# Patient Record
Sex: Female | Born: 1985 | Race: Black or African American | Hispanic: No | Marital: Single | Smoking: Current some day smoker
Health system: Southern US, Community
[De-identification: ages and names within clinical notes are randomized; demographics above are authoritative.]

## PROBLEM LIST (undated history)

## (undated) DIAGNOSIS — A749 Chlamydial infection, unspecified: Secondary | ICD-10-CM

## (undated) DIAGNOSIS — Z789 Other specified health status: Secondary | ICD-10-CM

## (undated) DIAGNOSIS — N809 Endometriosis, unspecified: Secondary | ICD-10-CM

## (undated) DIAGNOSIS — A599 Trichomoniasis, unspecified: Secondary | ICD-10-CM

## (undated) DIAGNOSIS — A549 Gonococcal infection, unspecified: Secondary | ICD-10-CM

## (undated) HISTORY — PX: NO PAST SURGERIES: SHX2092

## (undated) HISTORY — DX: Endometriosis, unspecified: N80.9

---
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
## 2010-08-29 NOTE — L&D Delivery Note (Signed)
Delivery Note At 4:36 AM a viable female was delivered via Vaginal, Spontaneous Delivery (Presentation: Left Occiput Anterior).  APGAR: , ; weight .   Placenta status: Intact, Spontaneous.  Cord: 3 vessels with the following complications: .  Cord pH: not done  Anesthesia: Epidural  Episiotomy: None Lacerations: 2nd degree Suture Repair: 2.0 chromic vicryl Est. Blood Loss (mL):   Mom to postpartum.  Baby to nursery-stable.  MARSHALL,BERNARD A 06/10/2011, 4:47 AM

## 2010-09-24 ENCOUNTER — Emergency Department (HOSPITAL_COMMUNITY)
Admission: EM | Admit: 2010-09-24 | Discharge: 2010-09-24 | Payer: Self-pay | Source: Home / Self Care | Admitting: Emergency Medicine

## 2010-09-24 LAB — URINALYSIS, ROUTINE W REFLEX MICROSCOPIC
Ketones, ur: 15 mg/dL — AB
Nitrite: NEGATIVE
Protein, ur: NEGATIVE mg/dL
Urine Glucose, Fasting: NEGATIVE mg/dL
pH: 6.5 (ref 5.0–8.0)

## 2010-09-24 LAB — DIFFERENTIAL
Basophils Absolute: 0.1 10*3/uL (ref 0.0–0.1)
Basophils Relative: 1 % (ref 0–1)
Eosinophils Absolute: 0.1 10*3/uL (ref 0.0–0.7)
Eosinophils Relative: 1 % (ref 0–5)
Lymphs Abs: 2.1 10*3/uL (ref 0.7–4.0)
Neutrophils Relative %: 73 % (ref 43–77)

## 2010-09-24 LAB — CBC
Platelets: 187 10*3/uL (ref 150–400)
RBC: 4.3 MIL/uL (ref 3.87–5.11)
RDW: 14.7 % (ref 11.5–15.5)
WBC: 12.9 10*3/uL — ABNORMAL HIGH (ref 4.0–10.5)

## 2010-09-24 LAB — WET PREP, GENITAL
Trich, Wet Prep: NONE SEEN
Yeast Wet Prep HPF POC: NONE SEEN

## 2010-09-24 LAB — POCT PREGNANCY, URINE: Preg Test, Ur: POSITIVE

## 2010-09-25 LAB — GC/CHLAMYDIA PROBE AMP, GENITAL: GC Probe Amp, Genital: NEGATIVE

## 2010-11-30 LAB — HEPATITIS B SURFACE ANTIGEN: Hepatitis B Surface Ag: NEGATIVE

## 2010-11-30 LAB — ABO/RH: RH Type: POSITIVE

## 2010-11-30 LAB — HIV ANTIBODY (ROUTINE TESTING W REFLEX): HIV: NONREACTIVE

## 2011-05-10 LAB — STREP B DNA PROBE: GBS: NEGATIVE

## 2011-06-01 ENCOUNTER — Other Ambulatory Visit: Payer: Self-pay | Admitting: Obstetrics & Gynecology

## 2011-06-01 DIAGNOSIS — O480 Post-term pregnancy: Secondary | ICD-10-CM

## 2011-06-02 ENCOUNTER — Other Ambulatory Visit: Payer: Self-pay | Admitting: *Deleted

## 2011-06-02 ENCOUNTER — Other Ambulatory Visit: Payer: Self-pay | Admitting: Obstetrics & Gynecology

## 2011-06-03 ENCOUNTER — Encounter (HOSPITAL_COMMUNITY): Payer: Self-pay

## 2011-06-03 ENCOUNTER — Ambulatory Visit (HOSPITAL_COMMUNITY)
Admission: RE | Admit: 2011-06-03 | Discharge: 2011-06-03 | Disposition: A | Discharge status: Home / Self Care | Payer: Medicaid Other | Source: Ambulatory Visit | Attending: Obstetrics & Gynecology | Admitting: Obstetrics & Gynecology

## 2011-06-03 DIAGNOSIS — O480 Post-term pregnancy: Secondary | ICD-10-CM

## 2011-06-06 ENCOUNTER — Telehealth (HOSPITAL_COMMUNITY): Payer: Self-pay | Admitting: *Deleted

## 2011-06-06 ENCOUNTER — Encounter (HOSPITAL_COMMUNITY): Payer: Self-pay | Admitting: *Deleted

## 2011-06-06 NOTE — Telephone Encounter (Signed)
Preadmission screen  

## 2011-06-08 ENCOUNTER — Inpatient Hospital Stay (HOSPITAL_COMMUNITY)
Admission: RE | Admit: 2011-06-08 | Discharge: 2011-06-12 | DRG: 775 | Disposition: A | Discharge status: Home / Self Care | Payer: Medicaid Other | Source: Ambulatory Visit | Attending: Obstetrics & Gynecology | Admitting: Obstetrics & Gynecology

## 2011-06-08 ENCOUNTER — Encounter (HOSPITAL_COMMUNITY): Payer: Self-pay

## 2011-06-08 DIAGNOSIS — O480 Post-term pregnancy: Principal | ICD-10-CM | POA: Diagnosis present

## 2011-06-08 HISTORY — DX: Other specified health status: Z78.9

## 2011-06-08 LAB — RPR: RPR Ser Ql: NONREACTIVE

## 2011-06-08 LAB — CBC
Hemoglobin: 12.3 g/dL (ref 12.0–15.0)
MCH: 31.9 pg (ref 26.0–34.0)
RBC: 3.86 MIL/uL — ABNORMAL LOW (ref 3.87–5.11)
WBC: 11 10*3/uL — ABNORMAL HIGH (ref 4.0–10.5)

## 2011-06-08 MED ORDER — CITRIC ACID-SODIUM CITRATE 334-500 MG/5ML PO SOLN: 30.0000 mL | ORAL | Status: DC | PRN

## 2011-06-08 MED ORDER — BUTORPHANOL TARTRATE 2 MG/ML IJ SOLN
1.0000 mg | INTRAMUSCULAR | Status: DC | PRN
  Administered 2011-06-08 – 2011-06-09 (×3): 1 mg via INTRAVENOUS
  Filled 2011-06-08 (×2): qty 1

## 2011-06-08 MED ORDER — SODIUM CHLORIDE 0.9 % IV SOLN: 250.0000 mL | INTRAVENOUS | Status: DC

## 2011-06-08 MED ORDER — LACTATED RINGERS IV SOLN
INTRAVENOUS | Status: DC
  Administered 2011-06-08 – 2011-06-09 (×3): via INTRAVENOUS
  Administered 2011-06-09: 999 mL/h via INTRAVENOUS
  Administered 2011-06-09 – 2011-06-10 (×2): via INTRAVENOUS

## 2011-06-08 MED ORDER — OXYTOCIN 20 UNITS IN LACTATED RINGERS INFUSION - SIMPLE
125.0000 mL/h | Freq: Once | INTRAVENOUS | Status: DC
Start: 2011-06-08 — End: 2011-06-10

## 2011-06-08 MED ORDER — OXYTOCIN BOLUS FROM INFUSION
500.0000 mL | Freq: Once | INTRAVENOUS | Status: DC
  Filled 2011-06-08: qty 500
  Filled 2011-06-08: qty 1000

## 2011-06-08 MED ORDER — BUTORPHANOL TARTRATE 2 MG/ML IJ SOLN
INTRAMUSCULAR | Status: AC
  Filled 2011-06-08: qty 1

## 2011-06-08 MED ORDER — TERBUTALINE SULFATE 1 MG/ML IJ SOLN: 0.2500 mg | Freq: Once | INTRAMUSCULAR | Status: AC | PRN

## 2011-06-08 MED ORDER — LACTATED RINGERS IV SOLN
500.0000 mL | INTRAVENOUS | Status: DC | PRN
Start: 2011-06-08 — End: 2011-06-10

## 2011-06-08 MED ORDER — FLEET ENEMA 7-19 GM/118ML RE ENEM: 1.0000 | ENEMA | RECTAL | Status: DC | PRN

## 2011-06-08 MED ORDER — SODIUM CHLORIDE 0.9 % IJ SOLN
3.0000 mL | INTRAMUSCULAR | Status: DC | PRN
  Administered 2011-06-09: 3 mL via INTRAVENOUS

## 2011-06-08 MED ORDER — ACETAMINOPHEN 325 MG PO TABS
650.0000 mg | ORAL_TABLET | ORAL | Status: DC | PRN
  Administered 2011-06-09: 650 mg via ORAL
  Filled 2011-06-08: qty 2

## 2011-06-08 MED ORDER — MISOPROSTOL 25 MCG QUARTER TABLET
25.0000 ug | ORAL_TABLET | ORAL | Status: DC | PRN
  Administered 2011-06-08 – 2011-06-09 (×5): 25 ug via VAGINAL
  Filled 2011-06-08 (×2): qty 0.25
  Filled 2011-06-08: qty 1
  Filled 2011-06-08 (×3): qty 0.25

## 2011-06-08 MED ORDER — SODIUM CHLORIDE 0.9 % IJ SOLN: 3.0000 mL | Freq: Two times a day (BID) | INTRAMUSCULAR | Status: DC

## 2011-06-08 MED ORDER — OXYCODONE-ACETAMINOPHEN 5-325 MG PO TABS
2.0000 | ORAL_TABLET | ORAL | Status: DC | PRN
  Administered 2011-06-10 – 2011-06-12 (×7): 2 via ORAL
  Filled 2011-06-08 (×6): qty 2
  Filled 2011-06-08: qty 1

## 2011-06-08 MED ORDER — LIDOCAINE HCL (PF) 1 % IJ SOLN
30.0000 mL | INTRAMUSCULAR | Status: DC | PRN
  Administered 2011-06-10: 30 mL via SUBCUTANEOUS
  Filled 2011-06-08: qty 30

## 2011-06-08 MED ORDER — ONDANSETRON HCL 4 MG/2ML IJ SOLN
4.0000 mg | Freq: Four times a day (QID) | INTRAMUSCULAR | Status: DC | PRN
  Administered 2011-06-09: 4 mg via INTRAVENOUS
  Filled 2011-06-08 (×2): qty 2

## 2011-06-08 NOTE — H&P (Signed)
Erica Mcclure is a 25 y.o. female presenting for IOL. Maternal Medical History:  Reason for admission: Reason for Admission:   nauseaIOL secondary to post dates  Fetal activity: Perceived fetal activity is normal.    Prenatal complications: no prenatal complications   OB History    Grav Para Term Preterm Abortions TAB SAB Ect Mult Living   1              Past Medical History  Diagnosis Date  . Asthma     had inhaler in 2011 unsure of name  . No pertinent past medical history    Past Surgical History  Procedure Date  . Laparoscopy 2005  . No past surgeries    Family History: family history includes Depression in her mother; Heart disease in her paternal grandmother; and Stroke in her paternal grandmother.  There is no history of Anesthesia problems, and Hypotension, and Malignant hyperthermia, and Pseudochol deficiency, . Social History:  reports that she has never smoked. She does not have any smokeless tobacco history on file. She reports that she does not drink alcohol or use illicit drugs.  Review of Systems  Constitutional: Negative for fever.  Eyes: Negative for blurred vision.  Respiratory: Negative for shortness of breath.   Gastrointestinal: Negative for nausea and vomiting.  Skin: Negative for rash.  Neurological: Negative for headaches.    Dilation: 1 Effacement (%): Thick Station: -2 Exam by:: Clearence Cheek RN Blood pressure 120/67, pulse 77, temperature 98.1 F (36.7 C), temperature source Oral, resp. rate 20, height 5\' 2"  (1.575 m), weight 87.544 kg (193 lb). Maternal Exam:  Abdomen: Patient reports no abdominal tenderness. Introitus: not evaluated.     Fetal Exam Fetal Monitor Review: Variability: moderate (6-25 bpm).   Pattern: accelerations present and no decelerations.    Fetal State Assessment: Category I - tracings are normal.     Physical Exam  Constitutional: She appears well-developed.  HENT:  Head: Normocephalic.  Neck: Neck  supple. No thyromegaly present.  Cardiovascular: Normal rate and regular rhythm.   Respiratory: Breath sounds normal.  GI: Soft. Bowel sounds are normal.  Skin: No rash noted.    Prenatal labs: ABO, Rh: O/Positive/-- (04/03 0000) Antibody: Negative (04/03 0000) Rubella:   RPR: NON REACTIVE (01/27 1320)  HBsAg: Negative (04/03 0000)  HIV: Non-reactive (04/03 0000)  GBS: Negative (09/11 0000)   Assessment/Plan: 25 y.o.  W/an IUP @ [redacted]w[redacted]d for IOL secondary to post dates  Admit Two-stage IOL   JACKSON-MOORE,Tanish Sinkler A 06/08/2011, 1:49 PM

## 2011-06-09 ENCOUNTER — Inpatient Hospital Stay (HOSPITAL_COMMUNITY): Payer: Medicaid Other | Admitting: Anesthesiology

## 2011-06-09 ENCOUNTER — Encounter (HOSPITAL_COMMUNITY): Payer: Self-pay | Admitting: Anesthesiology

## 2011-06-09 MED ORDER — PHENYLEPHRINE 40 MCG/ML (10ML) SYRINGE FOR IV PUSH (FOR BLOOD PRESSURE SUPPORT)
80.0000 ug | PREFILLED_SYRINGE | INTRAVENOUS | Status: DC | PRN
  Filled 2011-06-09 (×2): qty 5

## 2011-06-09 MED ORDER — EPHEDRINE 5 MG/ML INJ
10.0000 mg | INTRAVENOUS | Status: DC | PRN
  Filled 2011-06-09: qty 4

## 2011-06-09 MED ORDER — FENTANYL 2.5 MCG/ML BUPIVACAINE 1/10 % EPIDURAL INFUSION (WH - ANES)
INTRAMUSCULAR | Status: DC | PRN
  Administered 2011-06-09: 14 mL/h via EPIDURAL

## 2011-06-09 MED ORDER — EPHEDRINE 5 MG/ML INJ
10.0000 mg | INTRAVENOUS | Status: DC | PRN
  Filled 2011-06-09 (×2): qty 4

## 2011-06-09 MED ORDER — ZOLPIDEM TARTRATE 10 MG PO TABS
10.0000 mg | ORAL_TABLET | Freq: Every evening | ORAL | Status: DC | PRN
  Administered 2011-06-09: 10 mg via ORAL
  Filled 2011-06-09: qty 1

## 2011-06-09 MED ORDER — TERBUTALINE SULFATE 1 MG/ML IJ SOLN: 0.2500 mg | Freq: Once | INTRAMUSCULAR | Status: AC | PRN

## 2011-06-09 MED ORDER — SODIUM BICARBONATE 8.4 % IV SOLN
INTRAVENOUS | Status: DC | PRN
  Administered 2011-06-09: 5 mL via EPIDURAL

## 2011-06-09 MED ORDER — PHENYLEPHRINE 40 MCG/ML (10ML) SYRINGE FOR IV PUSH (FOR BLOOD PRESSURE SUPPORT)
80.0000 ug | PREFILLED_SYRINGE | INTRAVENOUS | Status: DC | PRN
  Filled 2011-06-09: qty 5

## 2011-06-09 MED ORDER — DIPHENHYDRAMINE HCL 50 MG/ML IJ SOLN: 12.5000 mg | INTRAMUSCULAR | Status: DC | PRN

## 2011-06-09 MED ORDER — OXYTOCIN 20 UNITS IN LACTATED RINGERS INFUSION - SIMPLE
1.0000 m[IU]/min | INTRAVENOUS | Status: DC
  Administered 2011-06-09: 2 m[IU]/min via INTRAVENOUS
  Administered 2011-06-10: 333 m[IU]/min via INTRAVENOUS

## 2011-06-09 MED ORDER — FENTANYL 2.5 MCG/ML BUPIVACAINE 1/10 % EPIDURAL INFUSION (WH - ANES)
14.0000 mL/h | INTRAMUSCULAR | Status: DC
  Administered 2011-06-09 – 2011-06-10 (×3): 14 mL/h via EPIDURAL
  Filled 2011-06-09 (×4): qty 60

## 2011-06-09 MED ORDER — LACTATED RINGERS IV SOLN: 500.0000 mL | Freq: Once | INTRAVENOUS | Status: DC

## 2011-06-09 NOTE — Anesthesia Procedure Notes (Signed)
Epidural Patient location during procedure: OB  Preanesthetic Checklist Completed: patient identified, site marked, surgical consent, pre-op evaluation, timeout performed, IV checked, risks and benefits discussed and monitors and equipment checked  Epidural Patient position: sitting Prep: site prepped and draped and DuraPrep Patient monitoring: continuous pulse ox and blood pressure Approach: midline Injection technique: LOR air  Needle:  Needle type: Tuohy  Needle gauge: 17 G Needle length: 9 cm Needle insertion depth: 6 cm Catheter type: closed end flexible Catheter size: 19 Gauge Catheter at skin depth: 12 cm Test dose: negative  Assessment Events: blood not aspirated, injection not painful, no injection resistance, negative IV test and no paresthesia  Additional Notes Dosing of Epidural: 1st dose, through needle...... ( mg expressed as equavilent  cc's medication  from .1%Bupiv / fentanyl syringe from L&D pump)...............  5mg Marcaine  2nd dose, through catheter after waiting 3 minutes.... epi 1:200K + Xylocaine 40 mg 3rd dose, through catheter, after waiting 3 minutes.....epi 1:200K + Xylocaine 60 mg ( 2% Xylo charted as a single dose in Epic Meds for ease of charting; actual dosing was fractionated as above, for saftey's sake)  As each dose occurred, patient was free of IV sx; and patient exhibited no evidence of SA injection.  Patient is more comfortable after epidural dosed. Please see RN's note for documentation of vital signs,and FHR which are stable.    

## 2011-06-09 NOTE — Anesthesia Preprocedure Evaluation (Signed)
Anesthesia Evaluation  Name, MR# and DOB Patient awake  General Assessment Comment  Reviewed: Allergy & Precautions, H&P , Patient's Chart, lab work & pertinent test results  Airway Mallampati: II TM Distance: >3 FB Neck ROM: full    Dental No notable dental hx.    Pulmonary Asthma: seasonal in spring.  clear to auscultation  Pulmonary exam normal       Cardiovascular Exercise Tolerance: Good regular Normal    Neuro/Psych    GI/Hepatic   Endo/Other  Morbid obesity  Renal/GU      Musculoskeletal   Abdominal   Peds  Hematology   Anesthesia Other Findings   Reproductive/Obstetrics                           Anesthesia Physical Anesthesia Plan  ASA: II  Anesthesia Plan: Epidural   Post-op Pain Management:    Induction:   Airway Management Planned:   Additional Equipment:   Intra-op Plan:   Post-operative Plan:   Informed Consent: I have reviewed the patients History and Physical, chart, labs and discussed the procedure including the risks, benefits and alternatives for the proposed anesthesia with the patient or authorized representative who has indicated his/her understanding and acceptance.   Dental Advisory Given  Plan Discussed with: CRNA and Surgeon  Anesthesia Plan Comments: (Labs checked- platelets confirmed with RN in room. Fetal heart tracing, per RN, reportedly stable enough for sitting procedure. Discussed epidural, and patient consents to the procedure:  included risk of possible headache,backache, failed block, allergic reaction, and nerve injury. This patient was asked if she had any questions or concerns before the procedure started. )        Anesthesia Quick Evaluation

## 2011-06-09 NOTE — Progress Notes (Signed)
Erica Mcclure is a 25 y.o. G1P0 at [redacted]w[redacted]d by LMP admitted for induction of labor due to post dates.  Subjective: Uncomfortable  Objective: BP 135/53  Pulse 66  Temp(Src) 98.4 F (36.9 C) (Oral)  Resp 20  Ht 5\' 2"  (1.575 m)  Wt 87.544 kg (193 lb)  BMI 35.30 kg/m2      FHT:  FHR: 140 bpm, variability: moderate,  accelerations:  Present,  decelerations:  Absent UC:   irregular, every 5 minutes SVE:   Dilation: 2 Effacement (%): 80 Station: -2 Exam by:: jackson-moore (IUPC placed)  Labs: Lab Results  Component Value Date   WBC 11.0* 06/08/2011   HGB 12.3 06/08/2011   HCT 36.2 06/08/2011   MCV 93.8 06/08/2011   PLT 160 06/08/2011    Assessment / Plan: Early labor  Labor: Pitocin for MVU <200 Preeclampsia:  N/A Fetal Wellbeing:  Category I Pain Control:  Labor support without medications I/D:  n/a Anticipated MOD:  NSVD  JACKSON-MOORE,Tevita Gomer A 06/09/2011, 2:00 PM

## 2011-06-10 ENCOUNTER — Encounter (HOSPITAL_COMMUNITY): Payer: Self-pay

## 2011-06-10 ENCOUNTER — Other Ambulatory Visit: Payer: Self-pay | Admitting: Obstetrics

## 2011-06-10 LAB — HEMOGLOBIN: Hemoglobin: 11.1 g/dL — ABNORMAL LOW (ref 12.0–15.0)

## 2011-06-10 MED ORDER — TETANUS-DIPHTH-ACELL PERTUSSIS 5-2.5-18.5 LF-MCG/0.5 IM SUSP: 0.5000 mL | Freq: Once | INTRAMUSCULAR | Status: DC

## 2011-06-10 MED ORDER — ONDANSETRON HCL 4 MG/2ML IJ SOLN: 4.0000 mg | INTRAMUSCULAR | Status: DC | PRN

## 2011-06-10 MED ORDER — IBUPROFEN 600 MG PO TABS: 600.0000 mg | ORAL_TABLET | Freq: Four times a day (QID) | ORAL | Status: DC

## 2011-06-10 MED ORDER — SIMETHICONE 80 MG PO CHEW: 80.0000 mg | CHEWABLE_TABLET | ORAL | Status: DC | PRN

## 2011-06-10 MED ORDER — WITCH HAZEL-GLYCERIN EX PADS
1.0000 "application " | MEDICATED_PAD | CUTANEOUS | Status: DC | PRN
  Administered 2011-06-10 – 2011-06-11 (×2): 1 via TOPICAL

## 2011-06-10 MED ORDER — DIPHENHYDRAMINE HCL 25 MG PO CAPS: 25.0000 mg | ORAL_CAPSULE | Freq: Four times a day (QID) | ORAL | Status: DC | PRN

## 2011-06-10 MED ORDER — FERROUS SULFATE 325 (65 FE) MG PO TABS
325.0000 mg | ORAL_TABLET | Freq: Two times a day (BID) | ORAL | Status: DC
  Administered 2011-06-10 – 2011-06-11 (×3): 325 mg via ORAL
  Filled 2011-06-10 (×4): qty 1

## 2011-06-10 MED ORDER — PRENATAL PLUS 27-1 MG PO TABS
1.0000 | ORAL_TABLET | Freq: Every day | ORAL | Status: DC
  Administered 2011-06-10 – 2011-06-11 (×2): 1 via ORAL
  Filled 2011-06-10 (×2): qty 1

## 2011-06-10 MED ORDER — ZOLPIDEM TARTRATE 5 MG PO TABS: 5.0000 mg | ORAL_TABLET | Freq: Every evening | ORAL | Status: DC | PRN

## 2011-06-10 MED ORDER — OXYCODONE HCL 10 MG PO TB12
10.0000 mg | ORAL_TABLET | Freq: Once | ORAL | Status: AC
  Administered 2011-06-10: 10 mg via ORAL
  Filled 2011-06-10: qty 1

## 2011-06-10 MED ORDER — ONDANSETRON HCL 4 MG PO TABS: 4.0000 mg | ORAL_TABLET | ORAL | Status: DC | PRN

## 2011-06-10 MED ORDER — DIBUCAINE 1 % RE OINT
1.0000 "application " | TOPICAL_OINTMENT | RECTAL | Status: DC | PRN
  Administered 2011-06-10 – 2011-06-11 (×2): 1 via RECTAL
  Filled 2011-06-10 (×3): qty 28

## 2011-06-10 MED ORDER — OXYCODONE-ACETAMINOPHEN 5-325 MG PO TABS
1.0000 | ORAL_TABLET | ORAL | Status: DC | PRN
  Administered 2011-06-11 – 2011-06-12 (×2): 1 via ORAL
  Filled 2011-06-10 (×2): qty 1
  Filled 2011-06-10: qty 2

## 2011-06-10 MED ORDER — LANOLIN HYDROUS EX OINT: TOPICAL_OINTMENT | CUTANEOUS | Status: DC | PRN

## 2011-06-10 MED ORDER — BENZOCAINE-MENTHOL 20-0.5 % EX AERO: 1.0000 "application " | INHALATION_SPRAY | CUTANEOUS | Status: DC | PRN

## 2011-06-10 MED ORDER — BENZOCAINE-MENTHOL 20-0.5 % EX AERO
INHALATION_SPRAY | CUTANEOUS | Status: AC
  Administered 2011-06-10: 08:00:00
  Filled 2011-06-10: qty 56

## 2011-06-10 MED ORDER — SENNOSIDES-DOCUSATE SODIUM 8.6-50 MG PO TABS
2.0000 | ORAL_TABLET | Freq: Every day | ORAL | Status: DC
  Administered 2011-06-10 – 2011-06-11 (×2): 2 via ORAL

## 2011-06-10 NOTE — Progress Notes (Signed)
  Post Partum Day 0 S/P spontaneous vaginal RH status/Rubella reviewed.  Feeding: breast Subjective: No HA, SOB, CP, F/C, breast symptoms. Normal vaginal bleeding, no clots.     Objective: BP 102/66  Pulse 85  Temp(Src) 98 F (36.7 C) (Oral)  Resp 18  Ht 5\' 2"  (1.575 m)  Wt 87.544 kg (193 lb)  BMI 35.30 kg/m2  SpO2 98%  Breastfeeding? Unknown   Physical Exam:  General: alert Lochia: appropriate Uterine Fundus: firm DVT Evaluation: No evidence of DVT seen on physical exam. Ext: No c/c/e  Basename 06/08/11 0730  HGB 12.3  HCT 36.2      Assessment/Plan: 25 y.o.  PPD #0 .  normal postpartum exam Continue current postpartum care  Ambulate   LOS: 2 days   JACKSON-MOORE,Willoughby Doell A 06/10/2011, 8:59 AM

## 2011-06-10 NOTE — Progress Notes (Signed)
PSYCHOSOCIAL ASSESSMENT ~ MATERNAL/CHILD Name: Erica Mcclure                                                                                           Age: 25   Referral Date: 06/10/11   Reason/Source: NICU Support/NICU  I. FAMILY/HOME ENVIRONMENT A. Child's Legal Guardian _x__Parent(s) ___Grandparent ___Foster parent ___DSS_________________ Name: Erica Mcclure                                 DOB: 08/27/1986           Age: 25   Address: [address], [place], [place] [address]  Name: Erica Mcclure                                        DOB: //                     Age:   Address: same  B. Other Household Members/Support Persons Name:                                         Relationship:                        DOB ___/___/___                   Name:                                         Relationship:                        DOB ___/___/___                   Name:                                         Relationship:                        DOB ___/___/___                   Name:                                         Relationship:                        DOB ___/___/___  C. Other Support: Good family support.  Family with her in room.   II. PSYCHOSOCIAL DATA A. Information   Source                                                                                                 _x_Patient Interview  _x_Family Interview           _x_Other: chart  B. Financial and Community Resources _x_Employment: FOB works for [employer], a v[employer]. _x_Medicaid    County: [place]                 __Private Insurance:                   __Self Pay  __Food Stamps   __WIC __Work First     __Public Housing     __Section 8    __Maternity Care Coordination/Child Service Coordination/Early Intervention  __School:                                                                         Grade:  __Other:   C. Cultural and Environment Information Cultural Issues Impacting Care: none  known  III. STRENGTHS _x__Supportive family/friends _x__Adequate Resources _x__Compliance with medical plan _x__Home prepared for Child (including basic supplies) _x__Understanding of illness      _x__Other: Pediatrician will be Dr. Rubin IV. RISK FACTORS AND CURRENT PROBLEMS         __x__No Problems Noted                                                                                                                                                                                                                                       Pt              Family     Substance Abuse                                                                  ___              ___        Mental Illness                                                                        ___              ___  Family/Relationship Issues                                      ___               ___             Abuse/Neglect/Domestic Violence                                         ___         ___  Financial Resources                                        ___              ___             Transportation                                                                        ___               ___  DSS Involvement                                                                   ___              ___  Adjustment to Illness                                                               ___              ___  Knowledge/Cognitive Deficit                                                     ___              ___             Compliance with Treatment                                                 ___              ___  Basic Needs (food, housing, etc.)                                          ___              ___             Housing Concerns                                       ___              ___ Other_____________________________________________________________            V. SOCIAL WORK ASSESSMENT SW met with MOB in her first floor room to introduce  myself, complete assessment and evaluate how she is coping with baby's admission to NICU.  She was very pleasant and told SW about her baby's birth and how scary it was when he came out with the cord around his neck.  SW discussed typical trauma reactions and stress of the situation.  She seemed very appreciative.  She states that she is glad he is doing okay, but wanted to know how long he would have to be in the NICU.  SW explained that they will probably have to do lab work and assess him for a period of time before they can make a plan for d/c.  She was understanding.  She states no issues with transportation if she has to d/c before the baby does and SW prepared her for this possibility.  She reports FOB is involved and supportive and that she has a great support system.  She also has the necessities for baby at home.  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  SW discussed this with her.  She thanked SW numerous times for coming to visit.  SW noticed a "previous" hx. Of marijuana use in MOB's chart, but did not get to discuss this with MOB since she had family present.  It is not clear in chart when this history was.  SW does not have any other concerns since meeting with MOB and will attempt to follow up on the issue of marijuana at a later time.  SW explained support services offered by NICU SWs.  MOB and family have no questions or needs at this time.  VI. SOCIAL WORK PLAN  ___No Further Intervention Required/No Barriers to Discharge   _x__Psychosocial Support and Ongoing Assessment of Needs   ___Patient/Family Education:   ___Child Protective Services Report   County___________ Date___/____/____   ___Information/Referral to Community Resources_________________________   ___Other:        

## 2011-06-10 NOTE — Progress Notes (Signed)
UR chart review completed.  

## 2011-06-11 LAB — CBC
MCH: 31.4 pg (ref 26.0–34.0)
MCV: 93 fL (ref 78.0–100.0)
Platelets: 147 10*3/uL — ABNORMAL LOW (ref 150–400)
RDW: 15.3 % (ref 11.5–15.5)

## 2011-06-11 NOTE — Anesthesia Postprocedure Evaluation (Signed)
  Anesthesia Post-op Note  Patient: Erica Mcclure  Procedure(s) Performed: * No procedures listed *  Patient Location: PACU and Mother/Baby  Anesthesia Type: Epidural  Level of Consciousness: awake, alert  and oriented  Airway and Oxygen Therapy: Patient Spontanous Breathing   Post-op Assessment: Patient's Cardiovascular Status Stable and Respiratory Function Stable  Post-op Vital Signs: stable  Complications: No apparent anesthesia complications

## 2011-06-11 NOTE — Progress Notes (Signed)
Encounter addended by: Len Blalock on: 06/11/2011  5:14 PM<BR>     Documentation filed: Notes Section, Charges VN

## 2011-06-11 NOTE — Progress Notes (Signed)
  Post Partum Day 1 S/P spontaneous vaginal RH status/Rubella reviewed.  Feeding: breast Subjective: No HA, SOB, CP, F/C, breast symptoms. Normal vaginal bleeding, no clots.     Objective: BP 96/54  Pulse 80  Temp(Src) 98.6 F (37 C) (Oral)  Resp 18  Ht 5\' 2"  (1.575 m)  Wt 87.544 kg (193 lb)  BMI 35.30 kg/m2  SpO2 98%  Breastfeeding? Unknown   Physical Exam:  General: alert Lochia: appropriate Uterine Fundus: firm DVT Evaluation: No evidence of DVT seen on physical exam. Ext: No c/c/e  Basename 06/11/11 0528 06/10/11 2230  HGB 10.8* 11.1*  HCT 32.0* --      Assessment/Plan: 25 y.o.  PPD #1 .  normal postpartum exam Continue current postpartum care  Ambulate   LOS: 3 days   JACKSON-MOORE,Damontre Millea A 06/11/2011, 9:56 AM

## 2011-06-12 MED ORDER — NORETHINDRONE 0.35 MG PO TABS: 1.0000 | ORAL_TABLET | Freq: Every day | ORAL | Status: DC

## 2011-06-12 MED ORDER — BENZOCAINE-MENTHOL 20-0.5 % EX AERO
INHALATION_SPRAY | CUTANEOUS | Status: AC
  Filled 2011-06-12: qty 56

## 2011-06-12 MED ORDER — OXYCODONE-ACETAMINOPHEN 5-325 MG PO TABS: 1.0000 | ORAL_TABLET | ORAL | Status: AC | PRN

## 2011-06-12 NOTE — Discharge Summary (Signed)
  Obstetric Discharge Summary Reason for Admission: onset of labor Prenatal Procedures: none Intrapartum Procedures: spontaneous vaginal delivery Postpartum Procedures: none Complications-Operative and Postpartum: none  Hemoglobin  Date Value Range Status  06/11/2011 10.8* 12.0-15.0 (g/dL) Final     HCT  Date Value Range Status  06/11/2011 32.0* 36.0-46.0 (%) Final    Discharge Diagnoses: Term Pregnancy-delivered  Discharge Information: Date: 06/12/2011 Activity: pelvic rest Diet: routine Medications: Percocet, Micronor Condition: stable Instructions: refer to routine discharge instructions Discharge to: home Follow-up Information    Follow up with HARPER,CHARLES A, MD. Call in 6 weeks.   Contact information:   [address] [place] [address] [phone]          Newborn Data: Live born  Information for the patient's newborn:  Mattilyn, Crites [[id number]]  female NICU  JACKSON-MOORE,Emsley Custer A 06/12/2011, 11:16 AM

## 2011-06-12 NOTE — Progress Notes (Signed)
Post Partum Day #2 S/P:spontaneous vaginal  RH status/Rubella reviewed.  Feeding: breast Subjective: Pt not in room.    Objective:  Blood pressure 112/64, pulse 86, temperature 98.6 F (37 C), temperature source Oral, resp. rate 18, height 5\' 2"  (1.575 m), weight 87.544 kg (193 lb), SpO2 98.00%, unknown if currently breastfeeding.   Physical Exam:  Deferred  Basename 06/11/11 0528 06/10/11 2230  HGB 10.8* 11.1*  HCT 32.0* --    Assessment/Plan: 25 y.o.  PPD # 2 .  normal postpartum exam Continue current postpartum care D/C home   LOS: 4 days   Mcclure,Erica Bower A 06/12/2011, 11:10 AM

## 2011-09-19 ENCOUNTER — Encounter (HOSPITAL_COMMUNITY): Payer: Self-pay | Admitting: *Deleted

## 2011-09-19 ENCOUNTER — Emergency Department (HOSPITAL_COMMUNITY)
Admission: EM | Admit: 2011-09-19 | Discharge: 2011-09-20 | Disposition: A | Discharge status: Home / Self Care | Payer: Medicaid Other | Attending: Emergency Medicine | Admitting: Emergency Medicine

## 2011-09-19 DIAGNOSIS — R05 Cough: Secondary | ICD-10-CM | POA: Insufficient documentation

## 2011-09-19 DIAGNOSIS — J45909 Unspecified asthma, uncomplicated: Secondary | ICD-10-CM | POA: Insufficient documentation

## 2011-09-19 DIAGNOSIS — J069 Acute upper respiratory infection, unspecified: Secondary | ICD-10-CM | POA: Insufficient documentation

## 2011-09-19 DIAGNOSIS — J3489 Other specified disorders of nose and nasal sinuses: Secondary | ICD-10-CM | POA: Insufficient documentation

## 2011-09-19 DIAGNOSIS — H9209 Otalgia, unspecified ear: Secondary | ICD-10-CM | POA: Insufficient documentation

## 2011-09-19 DIAGNOSIS — R059 Cough, unspecified: Secondary | ICD-10-CM | POA: Insufficient documentation

## 2011-09-19 DIAGNOSIS — R070 Pain in throat: Secondary | ICD-10-CM | POA: Insufficient documentation

## 2011-09-19 LAB — RAPID STREP SCREEN (MED CTR MEBANE ONLY): Streptococcus, Group A Screen (Direct): NEGATIVE

## 2011-09-19 NOTE — ED Notes (Signed)
sorethroat for 4 days.  hoarseness

## 2011-09-20 MED ORDER — OXYCODONE-ACETAMINOPHEN 5-325 MG PO TABS
2.0000 | ORAL_TABLET | ORAL | Status: AC | PRN
Start: 2011-09-20 — End: 2011-09-30

## 2011-09-20 MED ORDER — OXYCODONE-ACETAMINOPHEN 5-325 MG PO TABS
1.0000 | ORAL_TABLET | Freq: Once | ORAL | Status: AC
  Administered 2011-09-20: 1 via ORAL
  Filled 2011-09-20: qty 1

## 2011-09-20 NOTE — ED Notes (Signed)
DR. Weldon Inches AT BEDSIDE EVALUATING PT. / DICUSSING PLAN OF CARE .

## 2011-09-20 NOTE — Discharge Instructions (Signed)
Your strep test is negative.  You have a viral infection.  Use NyQuil to help reduce her symptoms.  Use ibuprofen 600 mg every 6 hours for pain.  Use Percocet for more severe pain.  Followup with your Dr. if your symptoms.  Last more than 2-3 days.  Return for worse or uncontrolled symptoms.

## 2011-09-20 NOTE — ED Provider Notes (Signed)
History     CSN: [id number]  Arrival date & time 09/19/11  2001   First MD Initiated Contact with Patient 09/20/11 0103      Chief Complaint  Patient presents with  . Sore Throat    (Consider location/radiation/quality/duration/timing/severity/associated sxs/prior treatment) Patient is a 26 y.o. female presenting with pharyngitis. The history is provided by the patient.  Sore Throat Pertinent negatives include no headaches and no shortness of breath.   the patient is a 26 year old, female, who complains of left ear pain, nonproductive cough, sore throat, and congestion for several days.  She has not had a fever.  She denies chest pain or shortness of breath.  She has no pain besides in her ear and throat.  She has no other symptoms.  Past Medical History  Diagnosis Date  . Asthma     had inhaler in 2011 unsure of name  . No pertinent past medical history     Past Surgical History  Procedure Date  . Laparoscopy 2005  . No past surgeries     Family History  Problem Relation Age of Onset  . Anesthesia problems Neg Hx   . Hypotension Neg Hx   . Malignant hyperthermia Neg Hx   . Pseudochol deficiency Neg Hx   . Depression Mother   . Stroke Paternal Grandmother   . Heart disease Paternal Grandmother     History  Substance Use Topics  . Smoking status: Never Smoker   . Smokeless tobacco: Not on file  . Alcohol Use: No    OB History    Grav Para Term Preterm Abortions TAB SAB Ect Mult Living   1 1 1       1       Review of Systems  Constitutional: Negative for fever and chills.  HENT: Positive for ear pain and congestion. Negative for ear discharge.   Respiratory: Positive for cough. Negative for shortness of breath.   Gastrointestinal: Negative for nausea and vomiting.  Neurological: Negative for headaches.  Psychiatric/Behavioral: Negative for confusion.  All other systems reviewed and are negative.    Allergies  Ibuprofen and Latex  Home Medications    Current Outpatient Rx  Name Route Sig Dispense Refill  . DM-PHENYLEPHRINE-ACETAMINOPHEN 10-5-325 MG PO TABS Oral Take 1 capsule by mouth every 12 (twelve) hours as needed. For sore throat and cold symptoms      BP 110/74  Pulse 60  Temp(Src) 98 F (36.7 C) (Oral)  Resp 20  SpO2 98%  LMP 08/19/2011  Physical Exam  Constitutional: She is oriented to person, place, and time. She appears well-developed and well-nourished.  HENT:  Head: Normocephalic and atraumatic.  Right Ear: External ear normal.  Mouth/Throat: Oropharynx is clear and moist.       Left TM is erythematous with bubbles behind the tympanic membrane  Eyes: Pupils are equal, round, and reactive to light.  Neck: Normal range of motion.  Pulmonary/Chest: Effort normal. No respiratory distress.  Musculoskeletal: Normal range of motion.  Neurological: She is alert and oriented to person, place, and time.  Skin: Skin is warm and dry. No rash noted. No erythema.  Psychiatric: She has a normal mood and affect. Her behavior is normal.    ED Course  Procedures (including critical care time) 26 year old the female, with a nonproductive cough, sore throat, otalgia, and congestion for several days.  No fever.  Strep test is negative. We'll treat for viral infection symptomatically.   Labs Reviewed  RAPID STREP SCREEN  No results found.   No diagnosis found.    MDM  Viral uri Nontoxic.  No distress.  No strep throat.          Nicholes Stairs, MD 09/20/11 [phone]

## 2012-01-06 ENCOUNTER — Other Ambulatory Visit: Payer: Self-pay | Admitting: Obstetrics

## 2012-12-15 ENCOUNTER — Encounter (HOSPITAL_COMMUNITY): Payer: Self-pay | Admitting: Emergency Medicine

## 2012-12-15 ENCOUNTER — Emergency Department (HOSPITAL_COMMUNITY): Payer: Medicaid Other

## 2012-12-15 ENCOUNTER — Emergency Department (HOSPITAL_COMMUNITY)
Admission: EM | Admit: 2012-12-15 | Discharge: 2012-12-15 | Disposition: A | Discharge status: Home / Self Care | Payer: Medicaid Other | Attending: Emergency Medicine | Admitting: Emergency Medicine

## 2012-12-15 DIAGNOSIS — J45909 Unspecified asthma, uncomplicated: Secondary | ICD-10-CM | POA: Insufficient documentation

## 2012-12-15 DIAGNOSIS — J189 Pneumonia, unspecified organism: Secondary | ICD-10-CM

## 2012-12-15 DIAGNOSIS — Z79899 Other long term (current) drug therapy: Secondary | ICD-10-CM | POA: Insufficient documentation

## 2012-12-15 DIAGNOSIS — J159 Unspecified bacterial pneumonia: Secondary | ICD-10-CM | POA: Insufficient documentation

## 2012-12-15 MED ORDER — AZITHROMYCIN 250 MG PO TABS: 250.0000 mg | ORAL_TABLET | Freq: Every day | ORAL | Status: DC

## 2012-12-15 MED ORDER — DEXTROSE 5 % IV SOLN
500.0000 mg | Freq: Once | INTRAVENOUS | Status: AC
  Administered 2012-12-15: 500 mg via INTRAVENOUS
  Filled 2012-12-15: qty 500

## 2012-12-15 MED ORDER — OXYCODONE-ACETAMINOPHEN 5-325 MG PO TABS
1.0000 | ORAL_TABLET | Freq: Once | ORAL | Status: AC
  Administered 2012-12-15: 1 via ORAL
  Filled 2012-12-15: qty 1

## 2012-12-15 MED ORDER — FLUCONAZOLE 200 MG PO TABS: 200.0000 mg | ORAL_TABLET | Freq: Once | ORAL | Status: AC

## 2012-12-15 MED ORDER — SODIUM CHLORIDE 0.9 % IV BOLUS (SEPSIS)
1000.0000 mL | Freq: Once | INTRAVENOUS | Status: AC
  Administered 2012-12-15: 1000 mL via INTRAVENOUS

## 2012-12-15 MED ORDER — DEXTROSE 5 % IV SOLN
1.0000 g | Freq: Once | INTRAVENOUS | Status: AC
  Administered 2012-12-15: 1 g via INTRAVENOUS
  Filled 2012-12-15: qty 10

## 2012-12-15 NOTE — ED Notes (Signed)
Per pt, woke up coughing, pain with deep inspiration-has had cold for 2 weeks, treated with OTC meds with little relief

## 2012-12-18 NOTE — ED Provider Notes (Signed)
History    27 year old female with right-sided chest pain and cough. Patient has had congestion for the past 2 weeks. Today she woke up with a cough and pain in the right side of her chest with deep inspiration and also with coughing. No shortness of breath. No fevers or chills. No unusual leg pain or swelling. Past history of what sounds like mild intermittent asthma, otherwise healthy.  CSN: [id number]  Arrival date & time 12/15/12  0915   First MD Initiated Contact with Patient 12/15/12 [phone]      Chief Complaint  Patient presents with  . Shortness of Breath    (Consider location/radiation/quality/duration/timing/severity/associated sxs/prior treatment) HPI  Past Medical History  Diagnosis Date  . Asthma     had inhaler in 2011 unsure of name  . No pertinent past medical history     Past Surgical History  Procedure Laterality Date  . Laparoscopy  2005  . No past surgeries      Family History  Problem Relation Age of Onset  . Anesthesia problems Neg Hx   . Hypotension Neg Hx   . Malignant hyperthermia Neg Hx   . Pseudochol deficiency Neg Hx   . Depression Mother   . Stroke Paternal Grandmother   . Heart disease Paternal Grandmother     History  Substance Use Topics  . Smoking status: Never Smoker   . Smokeless tobacco: Not on file  . Alcohol Use: No    OB History   Grav Para Term Preterm Abortions TAB SAB Ect Mult Living   1 1 1       1       Review of Systems  All systems reviewed and negative, other than as noted in HPI.   Allergies  Ibuprofen and Latex  Home Medications   Current Outpatient Rx  Name  Route  Sig  Dispense  Refill  . DM-Phenylephrine-Acetaminophen (VICKS NATURE FUSION COLD & FLU) 10-5-325 MG TABS   Oral   Take 1 capsule by mouth every 12 (twelve) hours as needed. For sore throat and cold symptoms         . ibuprofen (ADVIL,MOTRIN) 200 MG tablet   Oral   Take 400 mg by mouth every 6 (six) hours as needed for pain.          Marland Kitchen azithromycin (ZITHROMAX) 250 MG tablet   Oral   Take 1 tablet (250 mg total) by mouth daily. Take first 2 tablets together, then 1 every day until finished.   6 tablet   0   . fluconazole (DIFLUCAN) 200 MG tablet   Oral   Take 1 tablet (200 mg total) by mouth once.   1 tablet   0     Take after you finish your azithromycin     BP 107/74  Pulse 82  Temp(Src) 98.4 F (36.9 C) (Oral)  Resp 20  SpO2 99%  LMP 11/30/2012  Physical Exam  Nursing note and vitals reviewed. Constitutional: She appears well-developed and well-nourished. No distress.  HENT:  Head: Normocephalic and atraumatic.  Eyes: Conjunctivae are normal. Right eye exhibits no discharge. Left eye exhibits no discharge.  Neck: Neck supple.  Cardiovascular: Normal rate, regular rhythm and normal heart sounds.  Exam reveals no gallop and no friction rub.   No murmur heard. Pulmonary/Chest: Effort normal and breath sounds normal. No respiratory distress.  Abdominal: Soft. She exhibits no distension. There is no tenderness.  Musculoskeletal: She exhibits no edema and no tenderness.  Lower extremities symmetric  as compared to each other. No calf tenderness. Negative Homan's. No palpable cords.   Neurological: She is alert.  Skin: Skin is warm and dry.  Psychiatric: She has a normal mood and affect. Her behavior is normal. Thought content normal.    ED Course  Procedures (including critical care time)  Labs Reviewed - No data to display No results found.   1. CAP (community acquired pneumonia)       MDM  27 year old female with right-sided chest pain cough. Chest x-ray with a right-sided pneumonia. Patient is not hypoxemic. She has no increased work of breathing. No history of immunocompromising state. If which is appropriate for outpatient treatment at this time. She is given a dose of Rocephin and azithromycin prior discharge. Prescription for continued antibiotics. Return cautions  discussed.        Raeford Razor, MD 12/18/12 1421

## 2013-03-23 ENCOUNTER — Encounter (HOSPITAL_COMMUNITY): Payer: Self-pay

## 2013-03-23 ENCOUNTER — Emergency Department (HOSPITAL_COMMUNITY)
Admission: EM | Admit: 2013-03-23 | Discharge: 2013-03-23 | Disposition: A | Discharge status: Home / Self Care | Payer: Medicaid Other | Attending: Emergency Medicine | Admitting: Emergency Medicine

## 2013-03-23 DIAGNOSIS — Z3202 Encounter for pregnancy test, result negative: Secondary | ICD-10-CM | POA: Insufficient documentation

## 2013-03-23 DIAGNOSIS — J45909 Unspecified asthma, uncomplicated: Secondary | ICD-10-CM | POA: Insufficient documentation

## 2013-03-23 DIAGNOSIS — R112 Nausea with vomiting, unspecified: Secondary | ICD-10-CM | POA: Insufficient documentation

## 2013-03-23 DIAGNOSIS — R109 Unspecified abdominal pain: Secondary | ICD-10-CM

## 2013-03-23 DIAGNOSIS — R1013 Epigastric pain: Secondary | ICD-10-CM | POA: Insufficient documentation

## 2013-03-23 LAB — CBC
MCV: 91.9 fL (ref 78.0–100.0)
Platelets: 181 10*3/uL (ref 150–400)
RDW: 14.6 % (ref 11.5–15.5)
WBC: 8.7 10*3/uL (ref 4.0–10.5)

## 2013-03-23 LAB — COMPREHENSIVE METABOLIC PANEL
AST: 17 U/L (ref 0–37)
Albumin: 4.2 g/dL (ref 3.5–5.2)
Chloride: 99 mEq/L (ref 96–112)
Creatinine, Ser: 0.54 mg/dL (ref 0.50–1.10)
Total Bilirubin: 0.2 mg/dL — ABNORMAL LOW (ref 0.3–1.2)

## 2013-03-23 LAB — URINALYSIS, ROUTINE W REFLEX MICROSCOPIC
Leukocytes, UA: NEGATIVE
Protein, ur: NEGATIVE mg/dL
Urobilinogen, UA: 0.2 mg/dL (ref 0.0–1.0)

## 2013-03-23 LAB — URINE MICROSCOPIC-ADD ON

## 2013-03-23 LAB — PREGNANCY, URINE: Preg Test, Ur: NEGATIVE

## 2013-03-23 MED ORDER — PROMETHAZINE HCL 25 MG PO TABS: 25.0000 mg | ORAL_TABLET | Freq: Four times a day (QID) | ORAL | Status: DC | PRN

## 2013-03-23 MED ORDER — HYDROCODONE-ACETAMINOPHEN 5-325 MG PO TABS: 1.0000 | ORAL_TABLET | ORAL | Status: DC | PRN

## 2013-03-23 MED ORDER — ONDANSETRON HCL 4 MG/2ML IJ SOLN
4.0000 mg | Freq: Once | INTRAMUSCULAR | Status: AC
  Administered 2013-03-23: 4 mg via INTRAVENOUS
  Filled 2013-03-23: qty 2

## 2013-03-23 MED ORDER — SODIUM CHLORIDE 0.9 % IV BOLUS (SEPSIS)
1000.0000 mL | Freq: Once | INTRAVENOUS | Status: AC
  Administered 2013-03-23: 1000 mL via INTRAVENOUS

## 2013-03-23 MED ORDER — MORPHINE SULFATE 4 MG/ML IJ SOLN
6.0000 mg | Freq: Once | INTRAMUSCULAR | Status: AC
  Administered 2013-03-23: 6 mg via INTRAVENOUS
  Filled 2013-03-23: qty 2

## 2013-03-23 MED ORDER — OMEPRAZOLE 20 MG PO CPDR: 20.0000 mg | DELAYED_RELEASE_CAPSULE | Freq: Every day | ORAL | Status: DC

## 2013-03-23 NOTE — ED Provider Notes (Signed)
CSN: [id number]     Arrival date & time 03/23/13  0745 History     First MD Initiated Contact with Patient 03/23/13 0757     Chief Complaint  Patient presents with  . Emesis   (Consider location/radiation/quality/duration/timing/severity/associated sxs/prior Treatment) The history is provided by the patient.   patient reports nausea and vomiting most mornings over the past 2-3 months.  She reports mild upper abdominal pain and new vaginal bleeding over the past 24-48 hours.  She denies lower abdominal pain.  She is on birth control.  She reports nausea without vomiting this morning.  No prior history of recurrent nausea vomiting.  She is not diabetic.  No history of gastroparesis.  No family history of inflammatory bowel disease.  She does report she had some diarrhea over the past several days.  No fevers or chills.  No urinary complaints.  No pain with intercourse or vaginal discharge.  Symptoms are mild to moderate in severity.  Nothing worsens or improves her symptoms  Past Medical History  Diagnosis Date  . Asthma     had inhaler in 2011 unsure of name  . No pertinent past medical history    Past Surgical History  Procedure Laterality Date  . Laparoscopy  2005  . No past surgeries     Family History  Problem Relation Age of Onset  . Anesthesia problems Neg Hx   . Hypotension Neg Hx   . Malignant hyperthermia Neg Hx   . Pseudochol deficiency Neg Hx   . Depression Mother   . Stroke Paternal Grandmother   . Heart disease Paternal Grandmother    History  Substance Use Topics  . Smoking status: Never Smoker   . Smokeless tobacco: Not on file  . Alcohol Use: No   OB History   Grav Para Term Preterm Abortions TAB SAB Ect Mult Living   1 1 1       1      Review of Systems  All other systems reviewed and are negative.    Allergies  Ibuprofen and Latex  Home Medications   Current Outpatient Rx  Name  Route  Sig  Dispense  Refill  . azithromycin (ZITHROMAX) 250 MG  tablet   Oral   Take 1 tablet (250 mg total) by mouth daily. Take first 2 tablets together, then 1 every day until finished.   6 tablet   0   . DM-Phenylephrine-Acetaminophen (VICKS NATURE FUSION COLD & FLU) 10-5-325 MG TABS   Oral   Take 1 capsule by mouth every 12 (twelve) hours as needed. For sore throat and cold symptoms         . HYDROcodone-acetaminophen (NORCO/VICODIN) 5-325 MG per tablet   Oral   Take 1 tablet by mouth every 4 (four) hours as needed for pain.   15 tablet   0   . ibuprofen (ADVIL,MOTRIN) 200 MG tablet   Oral   Take 400 mg by mouth every 6 (six) hours as needed for pain.         Marland Kitchen omeprazole (PRILOSEC) 20 MG capsule   Oral   Take 1 capsule (20 mg total) by mouth daily.   30 capsule   0   . promethazine (PHENERGAN) 25 MG tablet   Oral   Take 1 tablet (25 mg total) by mouth every 6 (six) hours as needed for nausea.   30 tablet   0    BP 107/63  Pulse 60  Temp(Src) 98.7 F (37.1 C) (Oral)  Resp 16  SpO2 98%  LMP 03/21/2013 Physical Exam  Nursing note and vitals reviewed. Constitutional: She is oriented to person, place, and time. She appears well-developed and well-nourished. No distress.  HENT:  Head: Normocephalic and atraumatic.  Eyes: EOM are normal.  Neck: Normal range of motion.  Cardiovascular: Normal rate, regular rhythm and normal heart sounds.   Pulmonary/Chest: Effort normal and breath sounds normal.  Abdominal: Soft. She exhibits no distension.  Mild upper abdominal tenderness without guarding or rebound.  Negative Murphy sign  Musculoskeletal: Normal range of motion.  Neurological: She is alert and oriented to person, place, and time.  Skin: Skin is warm and dry.  Psychiatric: She has a normal mood and affect. Judgment normal.    ED Course   Procedures (including critical care time)  Labs Reviewed  URINALYSIS, ROUTINE W REFLEX MICROSCOPIC - Abnormal; Notable for the following:    Hgb urine dipstick LARGE (*)    All  other components within normal limits  COMPREHENSIVE METABOLIC PANEL - Abnormal; Notable for the following:    Sodium 134 (*)    Total Bilirubin 0.2 (*)    All other components within normal limits  URINE MICROSCOPIC-ADD ON - Abnormal; Notable for the following:    Bacteria, UA FEW (*)    All other components within normal limits  PREGNANCY, URINE  CBC  LIPASE, BLOOD   No results found. 1. Nausea & vomiting   2. Abdominal pain     MDM  9:38 AM Patient feels much better at this time.  LFTs and lipase are normal.  I took a quick look at her gallbladder on bedside ultrasound CT no evidence of cholelithiasis.  The patient will followup with gastroenterology.  Some of this may represent gastritis.  The patient be started on Prilosec.  Home with nausea medicine.  She understands return to ER for new or worsening symptoms  Lyanne Co, MD 03/23/13 [phone]

## 2013-03-23 NOTE — ED Notes (Signed)
Attempted IV unsuccessfully x2. Asked Alaina RN to try.

## 2013-03-23 NOTE — ED Notes (Signed)
She states that for 2-3 months, she has vomited "every morning".  She also c/o occasional diarrhea also.  She is healthy-looking, and in no distress.  She further mentions that the implant contraceptive in her left upper arm is "hurting, and I wonder if it's working right".  She states she is currently on a "real heavy" menses.

## 2013-03-23 NOTE — ED Notes (Signed)
MD at bedside. 

## 2013-03-23 NOTE — ED Notes (Signed)
Pt educated about driving after morphine, will be taking bus home.

## 2013-04-10 ENCOUNTER — Encounter: Payer: Self-pay | Admitting: Obstetrics

## 2013-04-10 ENCOUNTER — Ambulatory Visit (INDEPENDENT_AMBULATORY_CARE_PROVIDER_SITE_OTHER): Payer: Medicaid Other | Admitting: Obstetrics

## 2013-04-10 VITALS — BP 101/68 | HR 46 | Temp 98.7°F | Ht 62.0 in | Wt 149.0 lb

## 2013-04-10 DIAGNOSIS — Z0000 Encounter for general adult medical examination without abnormal findings: Secondary | ICD-10-CM

## 2013-04-10 DIAGNOSIS — B9689 Other specified bacterial agents as the cause of diseases classified elsewhere: Secondary | ICD-10-CM | POA: Insufficient documentation

## 2013-04-10 DIAGNOSIS — F3289 Other specified depressive episodes: Secondary | ICD-10-CM

## 2013-04-10 DIAGNOSIS — K2970 Gastritis, unspecified, without bleeding: Secondary | ICD-10-CM

## 2013-04-10 DIAGNOSIS — N760 Acute vaginitis: Secondary | ICD-10-CM

## 2013-04-10 DIAGNOSIS — Z113 Encounter for screening for infections with a predominantly sexual mode of transmission: Secondary | ICD-10-CM

## 2013-04-10 DIAGNOSIS — A499 Bacterial infection, unspecified: Secondary | ICD-10-CM

## 2013-04-10 DIAGNOSIS — F329 Major depressive disorder, single episode, unspecified: Secondary | ICD-10-CM

## 2013-04-10 MED ORDER — METRONIDAZOLE 500 MG PO TABS: 500.0000 mg | ORAL_TABLET | Freq: Two times a day (BID) | ORAL | Status: DC

## 2013-04-10 MED ORDER — CITRANATAL HARMONY 27-1-250 MG PO CAPS: 1.0000 | ORAL_CAPSULE | Freq: Every day | ORAL | Status: DC

## 2013-04-10 NOTE — Progress Notes (Signed)
Subjective:     Erica Mcclure is a 27 y.o. female here for a routine exam.  Current complaints: annual exam. Pt states she is having cycles every 3 weeks on the Nexplanon. Pt states the cycles are heavy and she is passing clots. Pt states she has severe cramps and pain in her back and hips during that time. Pt states she is having a "wierd" sensation in her left arm and hand. Nexplanon was placed in that arm.  Personal health questionnaire reviewed: yes.   Gynecologic History Patient's last menstrual period was 03/21/2013. Contraception: Nexplanon Last Pap: 08/2010. Results were: normal  Obstetric History OB History  Gravida Para Term Preterm AB SAB TAB Ectopic Multiple Living  1 1 1       1     # Outcome Date GA Lbr Len/2nd Weight Sex Delivery Anes PTL Lv  1 TRM 06/10/11 [redacted]w[redacted]d 15:31 / 03:05 8 lb 11.1 oz (3.943 kg) M SVD EPI  Y       The following portions of the patient's history were reviewed and updated as appropriate: allergies, current medications, past family history, past medical history, past social history, past surgical history and problem list.  Review of Systems Pertinent items are noted in HPI.    Objective:    General appearance: alert and no distress Breasts: normal appearance, no masses or tenderness Abdomen: normal findings: soft, non-tender Pelvic: cervix normal in appearance, external genitalia normal, no adnexal masses or tenderness, no cervical motion tenderness, uterus normal size, shape, and consistency and vagina with grayish discharge. Extremities: extremities normal, atraumatic, no cyanosis or edema    Assessment:    Healthy female exam.   BV  Depression  Gastritis   Plan:    Education reviewed: safe sex/STD prevention and self breast exams. Contraception: Nexplanon. Follow up in: 1 year. Flagyl Rx.   H. Pylori IgG antigen drawn

## 2013-04-10 NOTE — Addendum Note (Signed)
Addended by: Julaine Hua on: 04/10/2013 03:02 PM   Modules accepted: Orders

## 2013-04-11 ENCOUNTER — Other Ambulatory Visit: Payer: Self-pay | Admitting: *Deleted

## 2013-04-11 LAB — H. PYLORI ANTIBODY, IGG: H Pylori IgG: <0.4 {ISR}

## 2013-04-11 LAB — HEPATITIS C ANTIBODY: HCV Ab: NEGATIVE

## 2013-04-11 LAB — WET PREP BY MOLECULAR PROBE: Candida species: NEGATIVE

## 2013-04-11 LAB — PAP IG W/ RFLX HPV ASCU

## 2013-04-23 ENCOUNTER — Encounter: Payer: Self-pay | Admitting: Obstetrics

## 2013-04-23 ENCOUNTER — Ambulatory Visit (INDEPENDENT_AMBULATORY_CARE_PROVIDER_SITE_OTHER): Payer: Medicaid Other | Admitting: Obstetrics

## 2013-04-23 VITALS — BP 125/84 | HR 70 | Temp 98.3°F | Ht 62.0 in | Wt 142.8 lb

## 2013-04-23 DIAGNOSIS — R110 Nausea: Secondary | ICD-10-CM | POA: Insufficient documentation

## 2013-04-23 DIAGNOSIS — B3731 Acute candidiasis of vulva and vagina: Secondary | ICD-10-CM | POA: Insufficient documentation

## 2013-04-23 DIAGNOSIS — B373 Candidiasis of vulva and vagina: Secondary | ICD-10-CM | POA: Insufficient documentation

## 2013-04-23 DIAGNOSIS — Z3046 Encounter for surveillance of implantable subdermal contraceptive: Secondary | ICD-10-CM

## 2013-04-23 DIAGNOSIS — Z0000 Encounter for general adult medical examination without abnormal findings: Secondary | ICD-10-CM

## 2013-04-23 MED ORDER — FLUCONAZOLE 150 MG PO TABS: 150.0000 mg | ORAL_TABLET | Freq: Once | ORAL | Status: DC

## 2013-04-23 MED ORDER — PNV PRENATAL PLUS MULTIVITAMIN 27-1 MG PO TABS: 1.0000 | ORAL_TABLET | Freq: Every day | ORAL | Status: DC

## 2013-04-23 MED ORDER — PROMETHAZINE HCL 25 MG PO TABS: 25.0000 mg | ORAL_TABLET | Freq: Four times a day (QID) | ORAL | Status: DC | PRN

## 2013-04-23 NOTE — Progress Notes (Signed)
.  NEXPLANON REMOVAL NOTE  Date of LMP:   unknown  Contraception used: *Nexplanon   Indications:  The patient desires discontinuation of  contraception.  She understands risks, benefits, and alternatives to Nexplanon and would like to proceed with removal of Nexplanon rod.  Anesthesia:   Lidocaine 1% plain.  Procedure:  A time-out was performed confirming the procedure and the patient's allergy status.  Complications: None                      The rod was palpated and the area was sterilely prepped.  The area beneath the distal tip was anesthetized with 1% xylocaine and the skin incised                       Over the tip and the tip was exposed, grasped with forcep and removed intact.   Steri strip                       And a bandage applied and the arm was wrapped with gauze bandage.  The patient tolerated well.  Instructions:  The patient was instructed to remove the dressing in 24 hours and that some bruising is to be expected.  She was advised to use over the counter analgesics as needed for any pain at the site.  She is to keep the area dry for 24 hours and to call if her hand or arm becomes cold, numb, or blue.  Return visit:  Return in 1 week

## 2013-05-08 ENCOUNTER — Ambulatory Visit: Payer: Medicaid Other | Admitting: Obstetrics

## 2013-06-04 ENCOUNTER — Other Ambulatory Visit: Payer: Self-pay | Admitting: *Deleted

## 2013-06-04 DIAGNOSIS — B373 Candidiasis of vulva and vagina: Secondary | ICD-10-CM

## 2013-06-04 DIAGNOSIS — B3731 Acute candidiasis of vulva and vagina: Secondary | ICD-10-CM

## 2013-06-04 MED ORDER — FLUCONAZOLE 150 MG PO TABS: 150.0000 mg | ORAL_TABLET | Freq: Once | ORAL | Status: DC

## 2013-06-05 ENCOUNTER — Other Ambulatory Visit: Payer: Self-pay | Admitting: Obstetrics

## 2013-06-05 DIAGNOSIS — B3731 Acute candidiasis of vulva and vagina: Secondary | ICD-10-CM

## 2013-06-05 DIAGNOSIS — B373 Candidiasis of vulva and vagina: Secondary | ICD-10-CM

## 2013-06-05 MED ORDER — FLUCONAZOLE 150 MG PO TABS: 150.0000 mg | ORAL_TABLET | Freq: Once | ORAL | Status: DC

## 2013-06-05 NOTE — Addendum Note (Signed)
Addended by: Brock Bad on: 06/05/2013 09:42 AM   Modules accepted: Orders

## 2013-08-14 ENCOUNTER — Encounter (HOSPITAL_COMMUNITY): Payer: Self-pay | Admitting: Emergency Medicine

## 2013-08-14 ENCOUNTER — Emergency Department (HOSPITAL_COMMUNITY): Payer: Medicaid Other

## 2013-08-14 ENCOUNTER — Emergency Department (HOSPITAL_COMMUNITY)
Admission: EM | Admit: 2013-08-14 | Discharge: 2013-08-14 | Disposition: A | Discharge status: Home / Self Care | Payer: Medicaid Other | Attending: Emergency Medicine | Admitting: Emergency Medicine

## 2013-08-14 DIAGNOSIS — Z79899 Other long term (current) drug therapy: Secondary | ICD-10-CM | POA: Insufficient documentation

## 2013-08-14 DIAGNOSIS — J45901 Unspecified asthma with (acute) exacerbation: Secondary | ICD-10-CM | POA: Insufficient documentation

## 2013-08-14 DIAGNOSIS — Z8742 Personal history of other diseases of the female genital tract: Secondary | ICD-10-CM | POA: Insufficient documentation

## 2013-08-14 DIAGNOSIS — Z91040 Latex allergy status: Secondary | ICD-10-CM | POA: Insufficient documentation

## 2013-08-14 DIAGNOSIS — R509 Fever, unspecified: Secondary | ICD-10-CM | POA: Insufficient documentation

## 2013-08-14 DIAGNOSIS — J029 Acute pharyngitis, unspecified: Secondary | ICD-10-CM | POA: Insufficient documentation

## 2013-08-14 DIAGNOSIS — F17200 Nicotine dependence, unspecified, uncomplicated: Secondary | ICD-10-CM | POA: Insufficient documentation

## 2013-08-14 DIAGNOSIS — R630 Anorexia: Secondary | ICD-10-CM | POA: Insufficient documentation

## 2013-08-14 DIAGNOSIS — J40 Bronchitis, not specified as acute or chronic: Secondary | ICD-10-CM

## 2013-08-14 LAB — RAPID STREP SCREEN (MED CTR MEBANE ONLY): Streptococcus, Group A Screen (Direct): NEGATIVE

## 2013-08-14 MED ORDER — PREDNISONE 20 MG PO TABS: ORAL_TABLET | ORAL | Status: DC

## 2013-08-14 MED ORDER — AMOXICILLIN 500 MG PO CAPS: 500.0000 mg | ORAL_CAPSULE | Freq: Three times a day (TID) | ORAL | Status: DC

## 2013-08-14 NOTE — ED Provider Notes (Signed)
CSN: [id number]     Arrival date & time 08/14/13  1002 History   First MD Initiated Contact with Patient 08/14/13 1003     Chief Complaint  Patient presents with  . Sore Throat   (Consider location/radiation/quality/duration/timing/severity/associated sxs/prior Treatment) HPI Patient reports history childhood asthma. She relates she used to live in [place] and her asthma did well there. However since moving back to [place] she has had more problems. She reports for the past year she's had a cough. She states 2 weeks ago she did have a low-grade fever of 100.2. She states she coughs up mucus with brown spots in it. She states she only feels short of breath when she is in a hot environment. She states the last time she had wheezing was about 6 months ago when she was diagnosed with pneumonia. She reports she's had a sore throat for the past 3-4 weeks. She also started having pain in her right ear 2 weeks ago and states it has lots of wax in it. She did have rhinorrhea 2 weeks ago but it is now gone. She states yesterday she took her dog's amoxicillin and took 250 mg in the morning and 250 mg in the evening. She does report decreased appetite. Patient states that her current work place where she has worked for the past 1 1/2 year,  that everybody is sick and it seems to go into cycles every 4-6 weeks. She thinks there may be mold in the environment and is very concerned about that. She states her child goes to daycare and she had him tested and he does not have strep throat.  PCP none  Past Medical History  Diagnosis Date  . Asthma     had inhaler in 2011 unsure of name  . No pertinent past medical history   . Endometriosis    Past Surgical History  Procedure Laterality Date  . Laparoscopy  2005  . No past surgeries     Family History  Problem Relation Age of Onset  . Anesthesia problems Neg Hx   . Hypotension Neg Hx   . Malignant hyperthermia Neg Hx   . Pseudochol deficiency Neg  Hx   . Depression Mother   . Stroke Paternal Grandmother   . Heart disease Paternal Grandmother    History  Substance Use Topics  . Smoking status: Current Every Day Smoker    Types: Cigarettes  . Smokeless tobacco: Not on file  . Alcohol Use: No   Employed at [employer] at home with her 2 yo son Smokes 1 cig a day  OB History   Grav Para Term Preterm Abortions TAB SAB Ect Mult Living   1 1 1       1      Review of Systems  All other systems reviewed and are negative.    Allergies  Ibuprofen and Latex  Home Medications   Current Outpatient Rx  Name  Route  Sig  Dispense  Refill  . amoxicillin (AMOXIL) 250 MG capsule   Oral   Take 250 mg by mouth once.         Marland Kitchen omeprazole (PRILOSEC) 20 MG capsule   Oral   Take 1 capsule (20 mg total) by mouth daily.   30 capsule   0    BP 114/68  Pulse 52  Temp(Src) 98.2 F (36.8 C) (Oral)  Resp 19  SpO2 100%  LMP 08/08/2013  Vital signs normal except bradycardia  Physical Exam  Nursing note and vitals reviewed. Constitutional: She is oriented to person, place, and time. She appears well-developed and well-nourished.  Non-toxic appearance. She does not appear ill. No distress.  HENT:  Head: Normocephalic and atraumatic.  Right Ear: Hearing, tympanic membrane, external ear and ear canal normal.  Left Ear: Hearing, tympanic membrane, external ear and ear canal normal.  Nose: Nose normal. No mucosal edema or rhinorrhea.  Mouth/Throat: Oropharynx is clear and moist and mucous membranes are normal. No dental abscesses or uvula swelling.  Patient has mild redness of her tonsils which are not enlarged. Her right tonsil may be minimally larger than the left.  Voice is normal  Eyes: Conjunctivae and EOM are normal. Pupils are equal, round, and reactive to light.  Neck: Normal range of motion and full passive range of motion without pain. Neck supple.  Cardiovascular: Normal rate, regular rhythm and normal heart  sounds.  Exam reveals no gallop and no friction rub.   No murmur heard. Pulmonary/Chest: Effort normal and breath sounds normal. No respiratory distress. She has no wheezes. She has no rhonchi. She has no rales. She exhibits no tenderness and no crepitus.  Abdominal: Soft. Normal appearance and bowel sounds are normal. She exhibits no distension. There is no tenderness. There is no rebound and no guarding.  Musculoskeletal: Normal range of motion. She exhibits no edema and no tenderness.  Moves all extremities well.  No supraclavicular or epitrochlear  lymphadenopathy  Lymphadenopathy:    She has no cervical adenopathy.  Neurological: She is alert and oriented to person, place, and time. She has normal strength. No cranial nerve deficit.  Skin: Skin is warm, dry and intact. No rash noted. No erythema. No pallor.  Psychiatric: She has a normal mood and affect. Her speech is normal and behavior is normal. Her mood appears not anxious.    ED Course  Procedures (including critical care time)    Labs Review  Results for orders placed during the hospital encounter of 08/14/13  RAPID STREP SCREEN      Result Value Range   Streptococcus, Group A Screen (Direct) NEGATIVE  NEGATIVE   Laboratory interpretation all normal  Imaging Review Dg Chest 2 View  08/14/2013   CLINICAL DATA:  Cough and congestion  EXAM: CHEST  2 VIEW  COMPARISON:  December 15, 2012  FINDINGS: Lungs are clear. Heart size and pulmonary vascularity are normal. No adenopathy. There is pectus excavatum.  IMPRESSION: Pectus excavatum.  No edema or consolidation.   Electronically Signed   By: Bretta Bang M.D.   On: 08/14/2013 10:46    EKG Interpretation   None       MDM   1. Bronchitis   2. Pharyngitis    New Prescriptions   AMOXICILLIN (AMOXIL) 500 MG CAPSULE    Take 1 capsule (500 mg total) by mouth 3 (three) times daily.   PREDNISONE (DELTASONE) 20 MG TABLET    Take 3 po QD x 3d , then 2 po QD x 3d then 1 po  QD x 3d    Plan discharge   Devoria Albe, MD, Franz Dell, MD 08/14/13 1059

## 2013-08-14 NOTE — ED Notes (Signed)
MD at bedside. 

## 2013-08-14 NOTE — ED Notes (Addendum)
Pt c/o sore throat and chills x 3 weeks and productive cough x 1 year.  Pain score 7/10.  Sts "I get short of breath when the heat is on.  The stuff I cough up has brown spots."  Pt sts runny nose x 2 weeks ago, that has since resolved.  Hx of asthma.

## 2013-08-14 NOTE — Progress Notes (Signed)
[facility] provided pt with a list of primary care resources. Patient stated that she was pending insurance on Jan. 1st.

## 2013-08-16 LAB — CULTURE, GROUP A STREP

## 2013-11-19 ENCOUNTER — Other Ambulatory Visit (INDEPENDENT_AMBULATORY_CARE_PROVIDER_SITE_OTHER): Payer: No Typology Code available for payment source

## 2013-11-19 VITALS — BP 112/71 | HR 57 | Temp 97.6°F | Ht 62.0 in | Wt 134.0 lb

## 2013-11-19 DIAGNOSIS — Z3200 Encounter for pregnancy test, result unknown: Secondary | ICD-10-CM

## 2013-11-19 LAB — POCT URINE PREGNANCY: Preg Test, Ur: POSITIVE

## 2013-11-19 NOTE — Progress Notes (Unsigned)
Pt in office today for a pregnancy test. Patient states she took a home pregnancy test about 5 days ago and states it was faintly positive.  Patient states she is unsure of her last menstrual cycle.

## 2013-11-20 LAB — HCG, QUANTITATIVE, PREGNANCY: hCG, Beta Chain, Quant, S: 16246.3 m[IU]/mL

## 2013-11-26 ENCOUNTER — Ambulatory Visit (INDEPENDENT_AMBULATORY_CARE_PROVIDER_SITE_OTHER): Payer: No Typology Code available for payment source

## 2013-11-26 ENCOUNTER — Ambulatory Visit (INDEPENDENT_AMBULATORY_CARE_PROVIDER_SITE_OTHER): Payer: No Typology Code available for payment source | Admitting: Obstetrics

## 2013-11-26 ENCOUNTER — Encounter: Payer: Self-pay | Admitting: Obstetrics

## 2013-11-26 ENCOUNTER — Other Ambulatory Visit: Payer: Self-pay | Admitting: Obstetrics

## 2013-11-26 ENCOUNTER — Ambulatory Visit: Payer: Medicaid Other | Admitting: Obstetrics

## 2013-11-26 VITALS — BP 108/79 | HR 74 | Temp 100.1°F | Ht 62.0 in | Wt 136.0 lb

## 2013-11-26 DIAGNOSIS — O3680X0 Pregnancy with inconclusive fetal viability, not applicable or unspecified: Secondary | ICD-10-CM

## 2013-11-26 DIAGNOSIS — Z3687 Encounter for antenatal screening for uncertain dates: Secondary | ICD-10-CM

## 2013-11-26 DIAGNOSIS — Z113 Encounter for screening for infections with a predominantly sexual mode of transmission: Secondary | ICD-10-CM

## 2013-11-26 DIAGNOSIS — N898 Other specified noninflammatory disorders of vagina: Secondary | ICD-10-CM

## 2013-11-26 LAB — RPR

## 2013-11-26 LAB — HIV ANTIBODY (ROUTINE TESTING W REFLEX): HIV: NONREACTIVE

## 2013-11-26 LAB — US OB COMP LESS 14 WKS

## 2013-11-26 NOTE — Progress Notes (Signed)
Subjective:     Erica Mcclure is a 28 y.o. female here for a routine exam.  Current complaints: in office today for a yeast infection. Patient states she is having discharge with itching and irritation. Patient denies any vaginal odor. Patient has confirmed pregnancy and currently does not intend to continue the pregnancy.  Personal health questionnaire reviewed: yes.   Gynecologic History Patient's last menstrual period was 08/08/2013. Contraception: none  Obstetric History OB History  Gravida Para Term Preterm AB SAB TAB Ectopic Multiple Living  2 1 1       1     # Outcome Date GA Lbr Len/2nd Weight Sex Delivery Anes PTL Lv  2 CUR           1 TRM 06/10/11 [redacted]w[redacted]d 15:31 / 03:05 8 lb 11.1 oz (3.943 kg) M SVD EPI  Y       The following portions of the patient's history were reviewed and updated as appropriate: allergies, current medications, past family history, past medical history, past social history, past surgical history and problem list.  Review of Systems Pertinent items are noted in HPI.    Objective:    General appearance: alert and no distress Abdomen: normal findings: soft, non-tender Pelvic: cervix normal in appearance, external genitalia normal, no adnexal masses or tenderness, no cervical motion tenderness, rectovaginal septum normal, uterus normal size, shape, and consistency and vagina with white discharge.    50% of 20 min visit spent on counseling and coordination of care.   Assessment:    Vaginitis.   Plan:    Education reviewed: Management of vaginitis. Flagyl / Diflucan Rx.

## 2013-11-27 ENCOUNTER — Encounter: Payer: Self-pay | Admitting: Obstetrics

## 2013-11-27 LAB — US OB DETAIL + 14 WK

## 2013-11-27 LAB — WET PREP BY MOLECULAR PROBE
CANDIDA SPECIES: NEGATIVE
Gardnerella vaginalis: POSITIVE — AB
Trichomonas vaginosis: POSITIVE — AB

## 2013-11-28 ENCOUNTER — Other Ambulatory Visit: Payer: Self-pay | Admitting: *Deleted

## 2013-11-28 DIAGNOSIS — B9689 Other specified bacterial agents as the cause of diseases classified elsewhere: Secondary | ICD-10-CM

## 2013-11-28 DIAGNOSIS — N760 Acute vaginitis: Principal | ICD-10-CM

## 2013-11-28 LAB — GC/CHLAMYDIA PROBE AMP
CT PROBE, AMP APTIMA: NEGATIVE
GC PROBE AMP APTIMA: NEGATIVE

## 2013-11-28 MED ORDER — METRONIDAZOLE 500 MG PO TABS: 500.0000 mg | ORAL_TABLET | Freq: Two times a day (BID) | ORAL | Status: DC

## 2013-11-28 MED ORDER — FLUCONAZOLE 150 MG PO TABS: 150.0000 mg | ORAL_TABLET | ORAL | Status: DC

## 2013-12-01 ENCOUNTER — Encounter: Payer: Self-pay | Admitting: Obstetrics

## 2013-12-01 DIAGNOSIS — N898 Other specified noninflammatory disorders of vagina: Secondary | ICD-10-CM | POA: Insufficient documentation

## 2013-12-01 DIAGNOSIS — Z113 Encounter for screening for infections with a predominantly sexual mode of transmission: Secondary | ICD-10-CM | POA: Insufficient documentation

## 2013-12-02 ENCOUNTER — Emergency Department (HOSPITAL_COMMUNITY)
Admission: EM | Admit: 2013-12-02 | Discharge: 2013-12-02 | Discharge status: Against Medical Advice | Payer: Medicaid Other | Attending: Emergency Medicine | Admitting: Emergency Medicine

## 2013-12-02 DIAGNOSIS — O9989 Other specified diseases and conditions complicating pregnancy, childbirth and the puerperium: Secondary | ICD-10-CM | POA: Insufficient documentation

## 2013-12-02 DIAGNOSIS — J45909 Unspecified asthma, uncomplicated: Secondary | ICD-10-CM | POA: Insufficient documentation

## 2013-12-02 DIAGNOSIS — O219 Vomiting of pregnancy, unspecified: Secondary | ICD-10-CM | POA: Insufficient documentation

## 2013-12-02 DIAGNOSIS — O99330 Smoking (tobacco) complicating pregnancy, unspecified trimester: Secondary | ICD-10-CM | POA: Insufficient documentation

## 2013-12-02 DIAGNOSIS — R109 Unspecified abdominal pain: Secondary | ICD-10-CM | POA: Insufficient documentation

## 2013-12-02 NOTE — ED Notes (Signed)
Pt was being asked triaged questions and began to state she had no time for this that she needed to be helped, the RN stated she had to ask these question to find out what was wrong, the pt then stated she didn't know why she had an attitude and that she would go to another facility, walked out the room yelling at the RN.  Stated we all had atitudes.

## 2013-12-02 NOTE — ED Notes (Signed)
Pt c/o vomiting, pt is [redacted] weeks pregnant. States she cannot stop vomiting. No active vomiting at the moment. Pt states she just threw up ten min's ago. Pt also c/o abd pain.

## 2013-12-02 NOTE — ED Notes (Signed)
Pt did not want to answer nurse questions in triage, pt caught and attitude was rolling on floor. Nurse attempted to explain to pt that answering questions was a part of the process in helping her. Pt stormed out of room stating she was going to another hospital.

## 2014-01-28 ENCOUNTER — Ambulatory Visit: Payer: No Typology Code available for payment source | Admitting: Obstetrics

## 2014-01-28 ENCOUNTER — Ambulatory Visit: Payer: Medicaid Other | Admitting: Advanced Practice Midwife

## 2014-04-25 ENCOUNTER — Inpatient Hospital Stay (HOSPITAL_COMMUNITY)
Admission: AD | Admit: 2014-04-25 | Discharge: 2014-04-25 | Disposition: A | Discharge status: Home / Self Care | Payer: Self-pay | Source: Ambulatory Visit | Attending: Obstetrics & Gynecology | Admitting: Obstetrics & Gynecology

## 2014-04-25 ENCOUNTER — Encounter (HOSPITAL_COMMUNITY): Payer: Self-pay | Admitting: *Deleted

## 2014-04-25 ENCOUNTER — Inpatient Hospital Stay (HOSPITAL_COMMUNITY): Payer: Medicaid Other

## 2014-04-25 DIAGNOSIS — A499 Bacterial infection, unspecified: Secondary | ICD-10-CM | POA: Insufficient documentation

## 2014-04-25 DIAGNOSIS — N83209 Unspecified ovarian cyst, unspecified side: Secondary | ICD-10-CM | POA: Insufficient documentation

## 2014-04-25 DIAGNOSIS — N949 Unspecified condition associated with female genital organs and menstrual cycle: Secondary | ICD-10-CM | POA: Insufficient documentation

## 2014-04-25 DIAGNOSIS — B9689 Other specified bacterial agents as the cause of diseases classified elsewhere: Secondary | ICD-10-CM | POA: Insufficient documentation

## 2014-04-25 DIAGNOSIS — N920 Excessive and frequent menstruation with regular cycle: Secondary | ICD-10-CM | POA: Insufficient documentation

## 2014-04-25 DIAGNOSIS — N760 Acute vaginitis: Secondary | ICD-10-CM | POA: Insufficient documentation

## 2014-04-25 HISTORY — DX: Gonococcal infection, unspecified: A54.9

## 2014-04-25 HISTORY — DX: Trichomoniasis, unspecified: A59.9

## 2014-04-25 HISTORY — DX: Chlamydial infection, unspecified: A74.9

## 2014-04-25 LAB — URINALYSIS, ROUTINE W REFLEX MICROSCOPIC
BILIRUBIN URINE: NEGATIVE
Glucose, UA: NEGATIVE mg/dL
Hgb urine dipstick: NEGATIVE
KETONES UR: NEGATIVE mg/dL
Leukocytes, UA: NEGATIVE
NITRITE: NEGATIVE
PROTEIN: NEGATIVE mg/dL
Specific Gravity, Urine: 1.015 (ref 1.005–1.030)
UROBILINOGEN UA: 0.2 mg/dL (ref 0.0–1.0)
pH: 7 (ref 5.0–8.0)

## 2014-04-25 LAB — WET PREP, GENITAL
TRICH WET PREP: NONE SEEN
YEAST WET PREP: NONE SEEN

## 2014-04-25 LAB — CBC
HCT: 40.9 % (ref 36.0–46.0)
Hemoglobin: 13.8 g/dL (ref 12.0–15.0)
MCH: 30.9 pg (ref 26.0–34.0)
MCHC: 33.7 g/dL (ref 30.0–36.0)
MCV: 91.7 fL (ref 78.0–100.0)
PLATELETS: 206 10*3/uL (ref 150–400)
RBC: 4.46 MIL/uL (ref 3.87–5.11)
RDW: 14.5 % (ref 11.5–15.5)
WBC: 8.2 10*3/uL (ref 4.0–10.5)

## 2014-04-25 LAB — POCT PREGNANCY, URINE: Preg Test, Ur: NEGATIVE

## 2014-04-25 MED ORDER — METRONIDAZOLE 500 MG PO TABS: 500.0000 mg | ORAL_TABLET | Freq: Two times a day (BID) | ORAL | Status: DC

## 2014-04-25 MED ORDER — NORETHIN ACE-ETH ESTRAD-FE 1-20 MG-MCG(24) PO TABS: 1.0000 | ORAL_TABLET | Freq: Every day | ORAL | Status: DC

## 2014-04-25 NOTE — MAU Provider Note (Signed)
Attestation of Attending Supervision of Advanced Practitioner (CNM/NP): Evaluation and management procedures were performed by the Advanced Practitioner under my supervision and collaboration.  I have reviewed the Advanced Practitioner's note and chart, and I agree with the management and plan.  HARRAWAY-SMITH, Eleny Cortez 1:19 PM

## 2014-04-25 NOTE — MAU Note (Signed)
Pt states she had TAB 4 months ago, did not go to F/U appt, but finished antibiotics.  Had regular periods after TAB in May & June, in July period was later & was very heavy.  Started bleeding again two weeks later, lasted 6 days.  Began having pelvic pain 2 days ago, no bleeding today.  Has vaginal cramping with urination.

## 2014-04-25 NOTE — Discharge Instructions (Signed)
Bacterial Vaginosis Bacterial vaginosis is a vaginal infection that occurs when the normal balance of bacteria in the vagina is disrupted. It results from an overgrowth of certain bacteria. This is the most common vaginal infection in women of childbearing age. Treatment is important to prevent complications, especially in pregnant women, as it can cause a premature delivery. CAUSES  Bacterial vaginosis is caused by an increase in harmful bacteria that are normally present in smaller amounts in the vagina. Several different kinds of bacteria can cause bacterial vaginosis. However, the reason that the condition develops is not fully understood. RISK FACTORS Certain activities or behaviors can put you at an increased risk of developing bacterial vaginosis, including:  Having a new sex partner or multiple sex partners.  Douching.  Using an intrauterine device (IUD) for contraception. Women do not get bacterial vaginosis from toilet seats, bedding, swimming pools, or contact with objects around them. SIGNS AND SYMPTOMS  Some women with bacterial vaginosis have no signs or symptoms. Common symptoms include:  Grey vaginal discharge.  A fishlike odor with discharge, especially after sexual intercourse.  Itching or burning of the vagina and vulva.  Burning or pain with urination. DIAGNOSIS  Your health care provider will take a medical history and examine the vagina for signs of bacterial vaginosis. A sample of vaginal fluid may be taken. Your health care provider will look at this sample under a microscope to check for bacteria and abnormal cells. A vaginal pH test may also be done.  TREATMENT  Bacterial vaginosis may be treated with antibiotic medicines. These may be given in the form of a pill or a vaginal cream. A second round of antibiotics may be prescribed if the condition comes back after treatment.  HOME CARE INSTRUCTIONS   Only take over-the-counter or prescription medicines as  directed by your health care provider.  If antibiotic medicine was prescribed, take it as directed. Make sure you finish it even if you start to feel better.  Do not have sex until treatment is completed.  Tell all sexual partners that you have a vaginal infection. They should see their health care provider and be treated if they have problems, such as a mild rash or itching.  Practice safe sex by using condoms and only having one sex partner. SEEK MEDICAL CARE IF:   Your symptoms are not improving after 3 days of treatment.  You have increased discharge or pain.  You have a fever. MAKE SURE YOU:   Understand these instructions.  Will watch your condition.  Will get help right away if you are not doing well or get worse. FOR MORE INFORMATION  Centers for Disease Control and Prevention, Division of STD Prevention: Appraiser[employer]raud[employer] American Sexual Health Association (ASHA): www.ashastd.org  Document Released: 08/15/2005 Document Revised: 06/05/2013 Document Reviewed: 03/27/2013 [facility] Patient Information 2015 [place], [place]. This information is not intended to replace advice given to you by your health care provider. Make sure you discuss any questions you have with your health care provider. Ovarian Cyst An ovarian cyst is a fluid-filled sac that forms on an ovary. The ovaries are small organs that produce eggs in women. Various types of cysts can form on the ovaries. Most are not cancerous. Many do not cause problems, and they often go away on their own. Some may cause symptoms and require treatment. Common types of ovarian cysts include:  Functional cysts--These cysts may occur every month during the menstrual cycle. This is normal. The cysts usually go away with the  next menstrual cycle if the woman does not get pregnant. Usually, there are no symptoms with a functional cyst.  Endometrioma cysts--These cysts form from the tissue that lines the uterus. They are also called  "chocolate cysts" because they become filled with blood that turns brown. This type of cyst can cause pain in the lower abdomen during intercourse and with your menstrual period.  Cystadenoma cysts--This type develops from the cells on the outside of the ovary. These cysts can get very big and cause lower abdomen pain and pain with intercourse. This type of cyst can twist on itself, cut off its blood supply, and cause severe pain. It can also easily rupture and cause a lot of pain.  Dermoid cysts--This type of cyst is sometimes found in both ovaries. These cysts may contain different kinds of body tissue, such as skin, teeth, hair, or cartilage. They usually do not cause symptoms unless they get very big.  Theca lutein cysts--These cysts occur when too much of a certain hormone (human chorionic gonadotropin) is produced and overstimulates the ovaries to produce an egg. This is most common after procedures used to assist with the conception of a baby (in vitro fertilization). CAUSES   Fertility drugs can cause a condition in which multiple large cysts are formed on the ovaries. This is called ovarian hyperstimulation syndrome.  A condition called polycystic ovary syndrome can cause hormonal imbalances that can lead to nonfunctional ovarian cysts. SIGNS AND SYMPTOMS  Many ovarian cysts do not cause symptoms. If symptoms are present, they may include:  Pelvic pain or pressure.  Pain in the lower abdomen.  Pain during sexual intercourse.  Increasing girth (swelling) of the abdomen.  Abnormal menstrual periods.  Increasing pain with menstrual periods.  Stopping having menstrual periods without being pregnant. DIAGNOSIS  These cysts are commonly found during a routine or annual pelvic exam. Tests may be ordered to find out more about the cyst. These tests may include:  Ultrasound.  X-ray of the pelvis.  CT scan.  MRI.  Blood tests. TREATMENT  Many ovarian cysts go away on their own  without treatment. Your health care provider may want to check your cyst regularly for 2-3 months to see if it changes. For women in menopause, it is particularly important to monitor a cyst closely because of the higher rate of ovarian cancer in menopausal women. When treatment is needed, it may include any of the following:  A procedure to drain the cyst (aspiration). This may be done using a long needle and ultrasound. It can also be done through a laparoscopic procedure. This involves using a thin, lighted tube with a tiny camera on the end (laparoscope) inserted through a small incision.  Surgery to remove the whole cyst. This may be done using laparoscopic surgery or an open surgery involving a larger incision in the lower abdomen.  Hormone treatment or birth control pills. These methods are sometimes used to help dissolve a cyst. HOME CARE INSTRUCTIONS   Only take over-the-counter or prescription medicines as directed by your health care provider.  Follow up with your health care provider as directed.  Get regular pelvic exams and Pap tests. SEEK MEDICAL CARE IF:   Your periods are late, irregular, or painful, or they stop.  Your pelvic pain or abdominal pain does not go away.  Your abdomen becomes larger or swollen.  You have pressure on your bladder or trouble emptying your bladder completely.  You have pain during sexual intercourse.  You  have feelings of fullness, pressure, or discomfort in your stomach.  You lose weight for no apparent reason.  You feel generally ill.  You become constipated.  You lose your appetite.  You develop acne.  You have an increase in body and facial hair.  You are gaining weight, without changing your exercise and eating habits.  You think you are pregnant. SEEK IMMEDIATE MEDICAL CARE IF:   You have increasing abdominal pain.  You feel sick to your stomach (nauseous), and you throw up (vomit).  You develop a fever that comes on  suddenly.  You have abdominal pain during a bowel movement.  Your menstrual periods become heavier than usual. MAKE SURE YOU:  Understand these instructions.  Will watch your condition.  Will get help right away if you are not doing well or get worse. Document Released: 08/15/2005 Document Revised: 08/20/2013 Document Reviewed: 04/22/2013 [facility] Patient Information 2015 [place], [place]. This information is not intended to replace advice given to you by your health care provider. Make sure you discuss any questions you have with your health care provider.

## 2014-04-25 NOTE — MAU Provider Note (Signed)
History     CSN: [id number]  Arrival date and time: 04/25/14 8338   First Provider Initiated Contact with Patient 04/25/14 0900      Chief Complaint  Patient presents with  . Pelvic Pain  . Menstrual Problem   HPI Comments: Erica Mcclure 28 y.o. [id number] presents to MAU with pelvic pain and abnormal vaginal bleeding ongoing since her TAB in April. She did not keep her follow up appointment after her TAB. She had 2 regular menses after TAB then irregular since then. She has been having very heavy periods and soaking heavy maxi pads every 45 minutes. She is currently sexually active with no birth control. Last sexual activity was over one month ago. LMP 8/10.15.  Pelvic Pain The patient's primary symptoms include pelvic pain.      Past Medical History  Diagnosis Date  . Asthma     had inhaler in 2011 unsure of name  . No pertinent past medical history   . Endometriosis   . Trichomoniasis   . Gonorrhea in female   . Chlamydia     Past Surgical History  Procedure Laterality Date  . Laparoscopy  2005  . No past surgeries      Family History  Problem Relation Age of Onset  . Anesthesia problems Neg Hx   . Hypotension Neg Hx   . Malignant hyperthermia Neg Hx   . Pseudochol deficiency Neg Hx   . Depression Mother   . Stroke Paternal Grandmother   . Heart disease Paternal Grandmother     History  Substance Use Topics  . Smoking status: Current Some Day Smoker    Types: Cigarettes  . Smokeless tobacco: Not on file  . Alcohol Use: No    Allergies:  Allergies  Allergen Reactions  . Ibuprofen Other (See Comments)    tachycardia  . Latex Itching    condoms    Prescriptions prior to admission  Medication Sig Dispense Refill  . acetaminophen (TYLENOL) 500 MG tablet Take 1,000 mg by mouth every 6 (six) hours as needed for mild pain.        Review of Systems  Constitutional: Negative.   HENT: Negative.   Eyes: Negative.   Respiratory: Negative.    Cardiovascular: Negative.   Gastrointestinal: Negative.   Genitourinary: Positive for pelvic pain.       Vaginal bleeding and pelvic pains  Musculoskeletal: Negative.   Skin: Negative.   Neurological: Negative.   Endo/Heme/Allergies: Negative.   Psychiatric/Behavioral: Negative.    Physical Exam   Blood pressure 130/75, pulse 64, temperature 97.7 F (36.5 C), temperature source Oral, resp. rate 16, height 5\' 2"  (1.575 m), weight 140 lb (63.504 kg), last menstrual period 04/07/2013, SpO2 97.00%, unknown if currently breastfeeding.  Physical Exam  Constitutional: She is oriented to person, place, and time. She appears well-developed and well-nourished. No distress.  HENT:  Head: Normocephalic and atraumatic.  GI: Soft. Bowel sounds are normal. She exhibits no distension. There is no tenderness. There is no rebound and no guarding.  Genitourinary:  Genital:external negative Vaginal:no blood/ small amount white discharge Cervix:clised/ thick Bimanual:uterus tender   Musculoskeletal: Normal range of motion.  Neurological: She is alert and oriented to person, place, and time.  Skin: Skin is warm and dry.  Psychiatric: She has a normal mood and affect. Her behavior is normal. Judgment and thought content normal.   Results for orders placed during the hospital encounter of 04/25/14 (from the past 24 hour(s))  URINALYSIS, ROUTINE W REFLEX  MICROSCOPIC     Status: None   Collection Time    04/25/14  8:47 AM      Result Value Ref Range   Color, Urine YELLOW  YELLOW   APPearance CLEAR  CLEAR   Specific Gravity, Urine 1.015  1.005 - 1.030   pH 7.0  5.0 - 8.0   Glucose, UA NEGATIVE  NEGATIVE mg/dL   Hgb urine dipstick NEGATIVE  NEGATIVE   Bilirubin Urine NEGATIVE  NEGATIVE   Ketones, ur NEGATIVE  NEGATIVE mg/dL   Protein, ur NEGATIVE  NEGATIVE mg/dL   Urobilinogen, UA 0.2  0.0 - 1.0 mg/dL   Nitrite NEGATIVE  NEGATIVE   Leukocytes, UA NEGATIVE  NEGATIVE  WET PREP, GENITAL      Status: Abnormal   Collection Time    04/25/14  9:05 AM      Result Value Ref Range   Yeast Wet Prep HPF POC NONE SEEN  NONE SEEN   Trich, Wet Prep NONE SEEN  NONE SEEN   Clue Cells Wet Prep HPF POC MODERATE (*) NONE SEEN   WBC, Wet Prep HPF POC MODERATE (*) NONE SEEN  CBC     Status: None   Collection Time    04/25/14  9:20 AM      Result Value Ref Range   WBC 8.2  4.0 - 10.5 K/uL   RBC 4.46  3.87 - 5.11 MIL/uL   Hemoglobin 13.8  12.0 - 15.0 g/dL   HCT 40.9  36.0 - 46.0 %   MCV 91.7  78.0 - 100.0 fL   MCH 30.9  26.0 - 34.0 pg   MCHC 33.7  30.0 - 36.0 g/dL   RDW 14.5  11.5 - 15.5 %   Platelets 206  150 - 400 K/uL  POCT PREGNANCY, URINE     Status: None   Collection Time    04/25/14 10:42 AM      Result Value Ref Range   Preg Test, Ur NEGATIVE  NEGATIVE   US Transvaginal Non-ob  04/25/2014   CLINICAL DATA:  Status post therapeutic abortion 4 months ago  EXAM: TRANSABDOMINAL AND TRANSVAGINAL ULTRASOUND OF PELVIS  TECHNIQUE: Both transabdominal and transvaginal ultrasound examinations of the pelvis were performed. Transabdominal technique was performed for global imaging of the pelvis including uterus, ovaries, adnexal regions, and pelvic cul-de-sac. It was necessary to proceed with endovaginal exam following the transabdominal exam to visualize the endometrium.  COMPARISON:  06/03/2011  FINDINGS: Uterus  Measurements: 7.5 x 3.4 x 5.0 cm. No fibroids or other mass visualized.  Endometrium  Thickness: 4 mm.  No focal abnormality visualized.  Right ovary  Measurements: 32 x 17 x 17 mm. 19 x 13 x 16 mm complex cystic structure in the right ovary containing a thin internal septation and some internal echoes.  Left ovary  Measurements: 33 x 17 x 14 mm. Normal appearance/no adnexal mass.  Other findings:  Moderate volume free fluid.  IMPRESSION: Small hemorrhagic cyst right ovary associated with free fluid in the pelvis. Otherwise negative.   Electronically Signed   By: Skipper Cliche M.D.   On:  04/25/2014 11:00   US Pelvis Complete  04/25/2014   CLINICAL DATA:  Status post therapeutic abortion 4 months ago  EXAM: TRANSABDOMINAL AND TRANSVAGINAL ULTRASOUND OF PELVIS  TECHNIQUE: Both transabdominal and transvaginal ultrasound examinations of the pelvis were performed. Transabdominal technique was performed for global imaging of the pelvis including uterus, ovaries, adnexal regions, and pelvic cul-de-sac. It was necessary to proceed  with endovaginal exam following the transabdominal exam to visualize the endometrium.  COMPARISON:  06/03/2011  FINDINGS: Uterus  Measurements: 7.5 x 3.4 x 5.0 cm. No fibroids or other mass visualized.  Endometrium  Thickness: 4 mm.  No focal abnormality visualized.  Right ovary  Measurements: 32 x 17 x 17 mm. 19 x 13 x 16 mm complex cystic structure in the right ovary containing a thin internal septation and some internal echoes.  Left ovary  Measurements: 33 x 17 x 14 mm. Normal appearance/no adnexal mass.  Other findings:  Moderate volume free fluid.  IMPRESSION: Small hemorrhagic cyst right ovary associated with free fluid in the pelvis. Otherwise negative.   Electronically Signed   By: Skipper Cliche M.D.   On: 04/25/2014 11:00    MAU Course  Procedures  MDM Pelvic ultrasound, CBC  Assessment and Plan   A: Ovarian Cysts Bacterial Vaginosis Contraception Management  P: Flagyl 500 mg Loestrin BCP # 3 refills Make an appointment with [facility] for family planning Note for work x 2days Follow up MAU as needed   Georgia Duff 04/25/2014, 9:18 AM

## 2014-04-26 LAB — GC/CHLAMYDIA PROBE AMP
CT Probe RNA: NEGATIVE
GC Probe RNA: NEGATIVE

## 2014-06-30 ENCOUNTER — Encounter (HOSPITAL_COMMUNITY): Payer: Self-pay | Admitting: *Deleted

## 2014-12-10 ENCOUNTER — Emergency Department (HOSPITAL_COMMUNITY)
Admission: EM | Admit: 2014-12-10 | Discharge: 2014-12-10 | Disposition: A | Discharge status: Home / Self Care | Payer: No Typology Code available for payment source | Attending: Emergency Medicine | Admitting: Emergency Medicine

## 2014-12-10 ENCOUNTER — Encounter (HOSPITAL_COMMUNITY): Payer: Self-pay | Admitting: Emergency Medicine

## 2014-12-10 DIAGNOSIS — J45909 Unspecified asthma, uncomplicated: Secondary | ICD-10-CM | POA: Diagnosis not present

## 2014-12-10 DIAGNOSIS — L509 Urticaria, unspecified: Secondary | ICD-10-CM | POA: Diagnosis not present

## 2014-12-10 DIAGNOSIS — J069 Acute upper respiratory infection, unspecified: Secondary | ICD-10-CM | POA: Insufficient documentation

## 2014-12-10 DIAGNOSIS — Z3202 Encounter for pregnancy test, result negative: Secondary | ICD-10-CM | POA: Insufficient documentation

## 2014-12-10 DIAGNOSIS — Z8619 Personal history of other infectious and parasitic diseases: Secondary | ICD-10-CM | POA: Insufficient documentation

## 2014-12-10 DIAGNOSIS — N760 Acute vaginitis: Secondary | ICD-10-CM | POA: Insufficient documentation

## 2014-12-10 DIAGNOSIS — Z91040 Latex allergy status: Secondary | ICD-10-CM | POA: Insufficient documentation

## 2014-12-10 DIAGNOSIS — R21 Rash and other nonspecific skin eruption: Secondary | ICD-10-CM | POA: Diagnosis present

## 2014-12-10 DIAGNOSIS — Z720 Tobacco use: Secondary | ICD-10-CM | POA: Insufficient documentation

## 2014-12-10 DIAGNOSIS — Z793 Long term (current) use of hormonal contraceptives: Secondary | ICD-10-CM | POA: Diagnosis not present

## 2014-12-10 DIAGNOSIS — B9689 Other specified bacterial agents as the cause of diseases classified elsewhere: Secondary | ICD-10-CM

## 2014-12-10 LAB — CBC WITH DIFFERENTIAL/PLATELET
Basophils Absolute: 0.1 10*3/uL (ref 0.0–0.1)
Basophils Relative: 1 % (ref 0–1)
EOS PCT: 3 % (ref 0–5)
Eosinophils Absolute: 0.3 10*3/uL (ref 0.0–0.7)
HCT: 39.1 % (ref 36.0–46.0)
HEMOGLOBIN: 13 g/dL (ref 12.0–15.0)
LYMPHS ABS: 1.7 10*3/uL (ref 0.7–4.0)
LYMPHS PCT: 15 % (ref 12–46)
MCH: 31.1 pg (ref 26.0–34.0)
MCHC: 33.2 g/dL (ref 30.0–36.0)
MCV: 93.5 fL (ref 78.0–100.0)
Monocytes Absolute: 1.4 10*3/uL — ABNORMAL HIGH (ref 0.1–1.0)
Monocytes Relative: 12 % (ref 3–12)
Neutro Abs: 8 10*3/uL — ABNORMAL HIGH (ref 1.7–7.7)
Neutrophils Relative %: 69 % (ref 43–77)
Platelets: 207 10*3/uL (ref 150–400)
RBC: 4.18 MIL/uL (ref 3.87–5.11)
RDW: 15 % (ref 11.5–15.5)
WBC: 11.5 10*3/uL — AB (ref 4.0–10.5)

## 2014-12-10 LAB — BASIC METABOLIC PANEL
Anion gap: 8 (ref 5–15)
BUN: 9 mg/dL (ref 6–23)
CALCIUM: 9.3 mg/dL (ref 8.4–10.5)
CO2: 27 mmol/L (ref 19–32)
CREATININE: 0.46 mg/dL — AB (ref 0.50–1.10)
Chloride: 102 mmol/L (ref 96–112)
GFR calc non Af Amer: >90 mL/min (ref >90)
GLUCOSE: 104 mg/dL — AB (ref 70–99)
POTASSIUM: 4.2 mmol/L (ref 3.5–5.1)
Sodium: 137 mmol/L (ref 135–145)

## 2014-12-10 LAB — WET PREP, GENITAL
Trich, Wet Prep: NONE SEEN
Yeast Wet Prep HPF POC: NONE SEEN

## 2014-12-10 LAB — I-STAT BETA HCG BLOOD, ED (MC, WL, AP ONLY)

## 2014-12-10 MED ORDER — PREDNISONE 20 MG PO TABS: ORAL_TABLET | ORAL | Status: DC

## 2014-12-10 MED ORDER — PREDNISONE 20 MG PO TABS
60.0000 mg | ORAL_TABLET | Freq: Once | ORAL | Status: AC
  Administered 2014-12-10: 60 mg via ORAL
  Filled 2014-12-10: qty 3

## 2014-12-10 MED ORDER — FAMOTIDINE 20 MG PO TABS: 20.0000 mg | ORAL_TABLET | Freq: Two times a day (BID) | ORAL | Status: DC

## 2014-12-10 MED ORDER — FAMOTIDINE 20 MG PO TABS
20.0000 mg | ORAL_TABLET | Freq: Once | ORAL | Status: AC
  Administered 2014-12-10: 20 mg via ORAL
  Filled 2014-12-10: qty 1

## 2014-12-10 MED ORDER — CLINDAMYCIN HCL 300 MG PO CAPS: 300.0000 mg | ORAL_CAPSULE | Freq: Two times a day (BID) | ORAL | Status: DC

## 2014-12-10 MED ORDER — METRONIDAZOLE 500 MG PO TABS: 500.0000 mg | ORAL_TABLET | Freq: Two times a day (BID) | ORAL | Status: DC

## 2014-12-10 MED ORDER — DIPHENHYDRAMINE HCL 25 MG PO TABS: 50.0000 mg | ORAL_TABLET | ORAL | Status: DC | PRN

## 2014-12-10 MED ORDER — DIPHENHYDRAMINE HCL 25 MG PO CAPS
50.0000 mg | ORAL_CAPSULE | Freq: Once | ORAL | Status: AC
  Administered 2014-12-10: 50 mg via ORAL
  Filled 2014-12-10: qty 2

## 2014-12-10 NOTE — ED Provider Notes (Signed)
CSN: [id number]     Arrival date & time 12/10/14  0910 History   First MD Initiated Contact with Patient 12/10/14 0915     Chief Complaint  Patient presents with  . Rash     (Consider location/radiation/quality/duration/timing/severity/associated sxs/prior Treatment) HPI  Erica Mcclure is a 29 y.o. female complaining of a rash that is "spreading like crazy" and vaginal discharge. The rash began this morning on her drive back from [place].  She states that her arms and thighs were itchy this morning and when the sun came up she noticed a red, raised rash on both.  She drove immediately to the ED. She has had no changed in lotions or soaps, and has not been in the woods or around any abnormal plants.  She has had an abnormal amount of sun exposure over the last few days while in [place] and she was in close contact with a Yorkie while on vacation.  She also states her diet was more healthy while out of town.  She has not tried anything to make it better. The vaginal discharge began around Friday or Saturday.  She states that it is not malodorous and she has no pain, just a mild crampy feeling.  The discharge is thick and white.  She has had 2 sexual partners in the last month and 6 sexual partners in the last 6 months.  She has been diagnosed with chlamydia, gonorrhea, and trichomonas in the past.  She had gonorrhea 3 months ago.  She uses condoms as birth control but is unsure about her pregnancy status.  She has had cold-like symptoms since Friday: runny nose, yellow nasal drainage, throat tenderness, 1 episode of vomiting on Sunday morning, and body aches.  These symptoms are relieved with Nyquil. She denies fever, cough, chest pain, or shortness of breath.     Past Medical History  Diagnosis Date  . Asthma     had inhaler in 2011 unsure of name  . No pertinent past medical history   . Endometriosis   . Trichomoniasis   . Gonorrhea in female   . Chlamydia    Past Surgical History   Procedure Laterality Date  . Laparoscopy  2005  . No past surgeries     Family History  Problem Relation Age of Onset  . Anesthesia problems Neg Hx   . Hypotension Neg Hx   . Malignant hyperthermia Neg Hx   . Pseudochol deficiency Neg Hx   . Depression Mother   . Stroke Paternal Grandmother   . Heart disease Paternal Grandmother    History  Substance Use Topics  . Smoking status: Current Some Day Smoker    Types: Cigarettes  . Smokeless tobacco: Not on file  . Alcohol Use: No   OB History    Gravida Para Term Preterm AB TAB SAB Ectopic Multiple Living   2 1 1  1 1    1      Review of Systems  10 systems reviewed and found to be negative, except as noted in the HPI.  Allergies  Ibuprofen and Latex  Home Medications   Prior to Admission medications   Medication Sig Start Date End Date Taking? Authorizing Provider  acetaminophen (TYLENOL) 500 MG tablet Take 1,000 mg by mouth every 6 (six) hours as needed for mild pain.   Yes Historical Provider, MD  clindamycin (CLEOCIN) 300 MG capsule Take 1 capsule (300 mg total) by mouth 2 (two) times daily. X 7 days 12/10/14   Elmyra Ricks  Taelyn Nemes, PA-C  diphenhydrAMINE (BENADRYL) 25 MG tablet Take 2 tablets (50 mg total) by mouth every 4 (four) hours as needed for itching. 12/10/14   Samaa Ueda, PA-C  famotidine (PEPCID) 20 MG tablet Take 1 tablet (20 mg total) by mouth 2 (two) times daily. 12/10/14   Albert Devaul, PA-C  Norethindrone Acetate-Ethinyl Estrad-FE (LOESTRIN 24 FE) 1-20 MG-MCG(24) tablet Take 1 tablet by mouth daily. Patient not taking: Reported on 12/10/2014 04/25/14   Olegario Messier, NP  predniSONE (DELTASONE) 20 MG tablet 3 tabs po day one, then 2 po daily x 4 days 12/10/14   Elmyra Ricks Ladeana Laplant, PA-C   BP 93/62 mmHg  Pulse 71  Temp(Src) 97.5 F (36.4 C) (Oral)  Resp 18  SpO2 100%  LMP 12/07/2014 Physical Exam  Constitutional: She is oriented to person, place, and time. She appears well-developed and  well-nourished. No distress.  HENT:  Head: Normocephalic and atraumatic.  Mouth/Throat: Oropharynx is clear and moist.  No drooling or stridor. Posterior pharynx mildly erythematous no significant tonsillar hypertrophy. No exudate. Soft palate rises symmetrically. No TTP or induration under tongue.   No tenderness to palpation of frontal or bilateral maxillary sinuses.  Positive rhinorrhea, positive mucosal edema in the bilateral nares  Bilateral tympanic membranes with normal architecture and good light reflex.    Eyes: Conjunctivae and EOM are normal. Pupils are equal, round, and reactive to light.  Neck: Normal range of motion. Neck supple.  Cardiovascular: Normal rate, regular rhythm and intact distal pulses.   Pulmonary/Chest: Effort normal and breath sounds normal. No stridor. No respiratory distress. She has no wheezes. She has no rales. She exhibits no tenderness.  Abdominal: Soft. Bowel sounds are normal. She exhibits no distension and no mass. There is no tenderness. There is no rebound and no guarding.  Genitourinary:  Pelvic exam is chaperoned by PA student Ephriam Knuckles: No rashes or lesions, there is a thick, white, non-foul-smelling vaginal discharge. No cervical motion or adnexal tenderness.  Musculoskeletal: Normal range of motion.  Neurological: She is alert and oriented to person, place, and time.  Skin:  Hive like rash to bilateral upper extremities, mildly excoriated, no warmth, induration, tenderness to palpation. Lesions are blanchable, sparing the mucous membranes and palms and soles.  Psychiatric: She has a normal mood and affect.  Nursing note and vitals reviewed.   ED Course  Procedures (including critical care time) Labs Review Labs Reviewed  WET PREP, GENITAL - Abnormal; Notable for the following:    Clue Cells Wet Prep HPF POC MANY (*)    WBC, Wet Prep HPF POC MODERATE (*)    All other components within normal limits  CBC WITH DIFFERENTIAL/PLATELET  - Abnormal; Notable for the following:    WBC 11.5 (*)    Neutro Abs 8.0 (*)    Monocytes Absolute 1.4 (*)    All other components within normal limits  BASIC METABOLIC PANEL - Abnormal; Notable for the following:    Glucose, Bld 104 (*)    Creatinine, Ser 0.46 (*)    All other components within normal limits  RPR  HIV ANTIBODY (ROUTINE TESTING)  I-STAT BETA HCG BLOOD, ED ([facility], [facility], [facility] ONLY)  GC/CHLAMYDIA PROBE AMP ([facility])    Imaging Review No results found.   EKG Interpretation None      MDM   Final diagnoses:  Hives  Bacterial vaginosis  URI (upper respiratory infection)    Filed Vitals:   12/10/14 0918 12/10/14 1146 12/10/14 1150  BP: 107/71  86/57 93/62  Pulse: 86 71   Temp: 97.5 F (36.4 C)    TempSrc: Oral    Resp: 16 18   SpO2: 100% 100%     Medications  predniSONE (DELTASONE) tablet 60 mg (60 mg Oral Given 12/10/14 1107)  famotidine (PEPCID) tablet 20 mg (20 mg Oral Given 12/10/14 1107)  diphenhydrAMINE (BENADRYL) capsule 50 mg (50 mg Oral Given 12/10/14 1107)    Erica Mcclure is a pleasant 29 y.o. female presenting with hive like rash and thick vaginal discharge. Rash with no red flags, pelvic exam not concerning for PID. Wet prep with clue cells, we'll treat for BV, will treat suspected contact dermatitis with prednisone, Pepcid and Benadryl. Patient also has upper respiratory infection which is likely viral. She is saturating well on room air, patient denies cough, fever, shortness of breath. Doubt pneumonia,  Evaluation does not show pathology that would require ongoing emergent intervention or inpatient treatment. Pt is hemodynamically stable and mentating appropriately. Discussed findings and plan with patient/guardian, who agrees with care plan. All questions answered. Return precautions discussed and outpatient follow up given.   Discharge Medication List as of 12/10/2014 12:06 PM    START taking these medications   Details  clindamycin  (CLEOCIN) 300 MG capsule Take 1 capsule (300 mg total) by mouth 2 (two) times daily. X 7 days, Starting 12/10/2014, Until Discontinued, Print    diphenhydrAMINE (BENADRYL) 25 MG tablet Take 2 tablets (50 mg total) by mouth every 4 (four) hours as needed for itching., Starting 12/10/2014, Until Discontinued, Print    famotidine (PEPCID) 20 MG tablet Take 1 tablet (20 mg total) by mouth 2 (two) times daily., Starting 12/10/2014, Until Discontinued, Print    predniSONE (DELTASONE) 20 MG tablet 3 tabs po day one, then 2 po daily x 4 days, Print             Monico Blitz, PA-C 12/11/14 2035  Milton Ferguson, MD 12/11/14 [phone]

## 2014-12-10 NOTE — Discharge Instructions (Signed)
°  Take your antibiotics as directed and to completion. You should never have any leftover antibiotics! Push fluids and stay well hydrated.  ° °Any antibiotic use can reduce the efficacy of hormonal birth control. Please use back up method of contraception.  ° °Please follow with your primary care doctor in the next 2 days for a check-up. They must obtain records for further management.  ° °Do not hesitate to return to the Emergency Department for any new, worsening or concerning symptoms.  ° °

## 2014-12-10 NOTE — ED Notes (Addendum)
Pt c/o raised red rash on arms and legs, onset today, pt states itching began last night, along with cough and throat itching. Pt denies exposure to new plants, new soaps, or woods. Pt also complains of non-odorous "white chunky" discharge, which she states she has "been trying to clean out with my fingers stuck up in there."

## 2014-12-11 LAB — RPR: RPR Ser Ql: NONREACTIVE

## 2014-12-11 LAB — HIV ANTIBODY (ROUTINE TESTING W REFLEX): HIV SCREEN 4TH GENERATION: NONREACTIVE

## 2014-12-11 LAB — GC/CHLAMYDIA PROBE AMP (~~LOC~~) NOT AT ARMC
CHLAMYDIA, DNA PROBE: NEGATIVE
NEISSERIA GONORRHEA: NEGATIVE

## 2015-05-03 IMAGING — CR DG CHEST 2V
2 series · 2 of 2 positions shown · non-contrast
Comparison: December 15, 2012

CLINICAL DATA: Cough and congestion

EXAM:
CHEST  2 VIEW

[w chest pa]
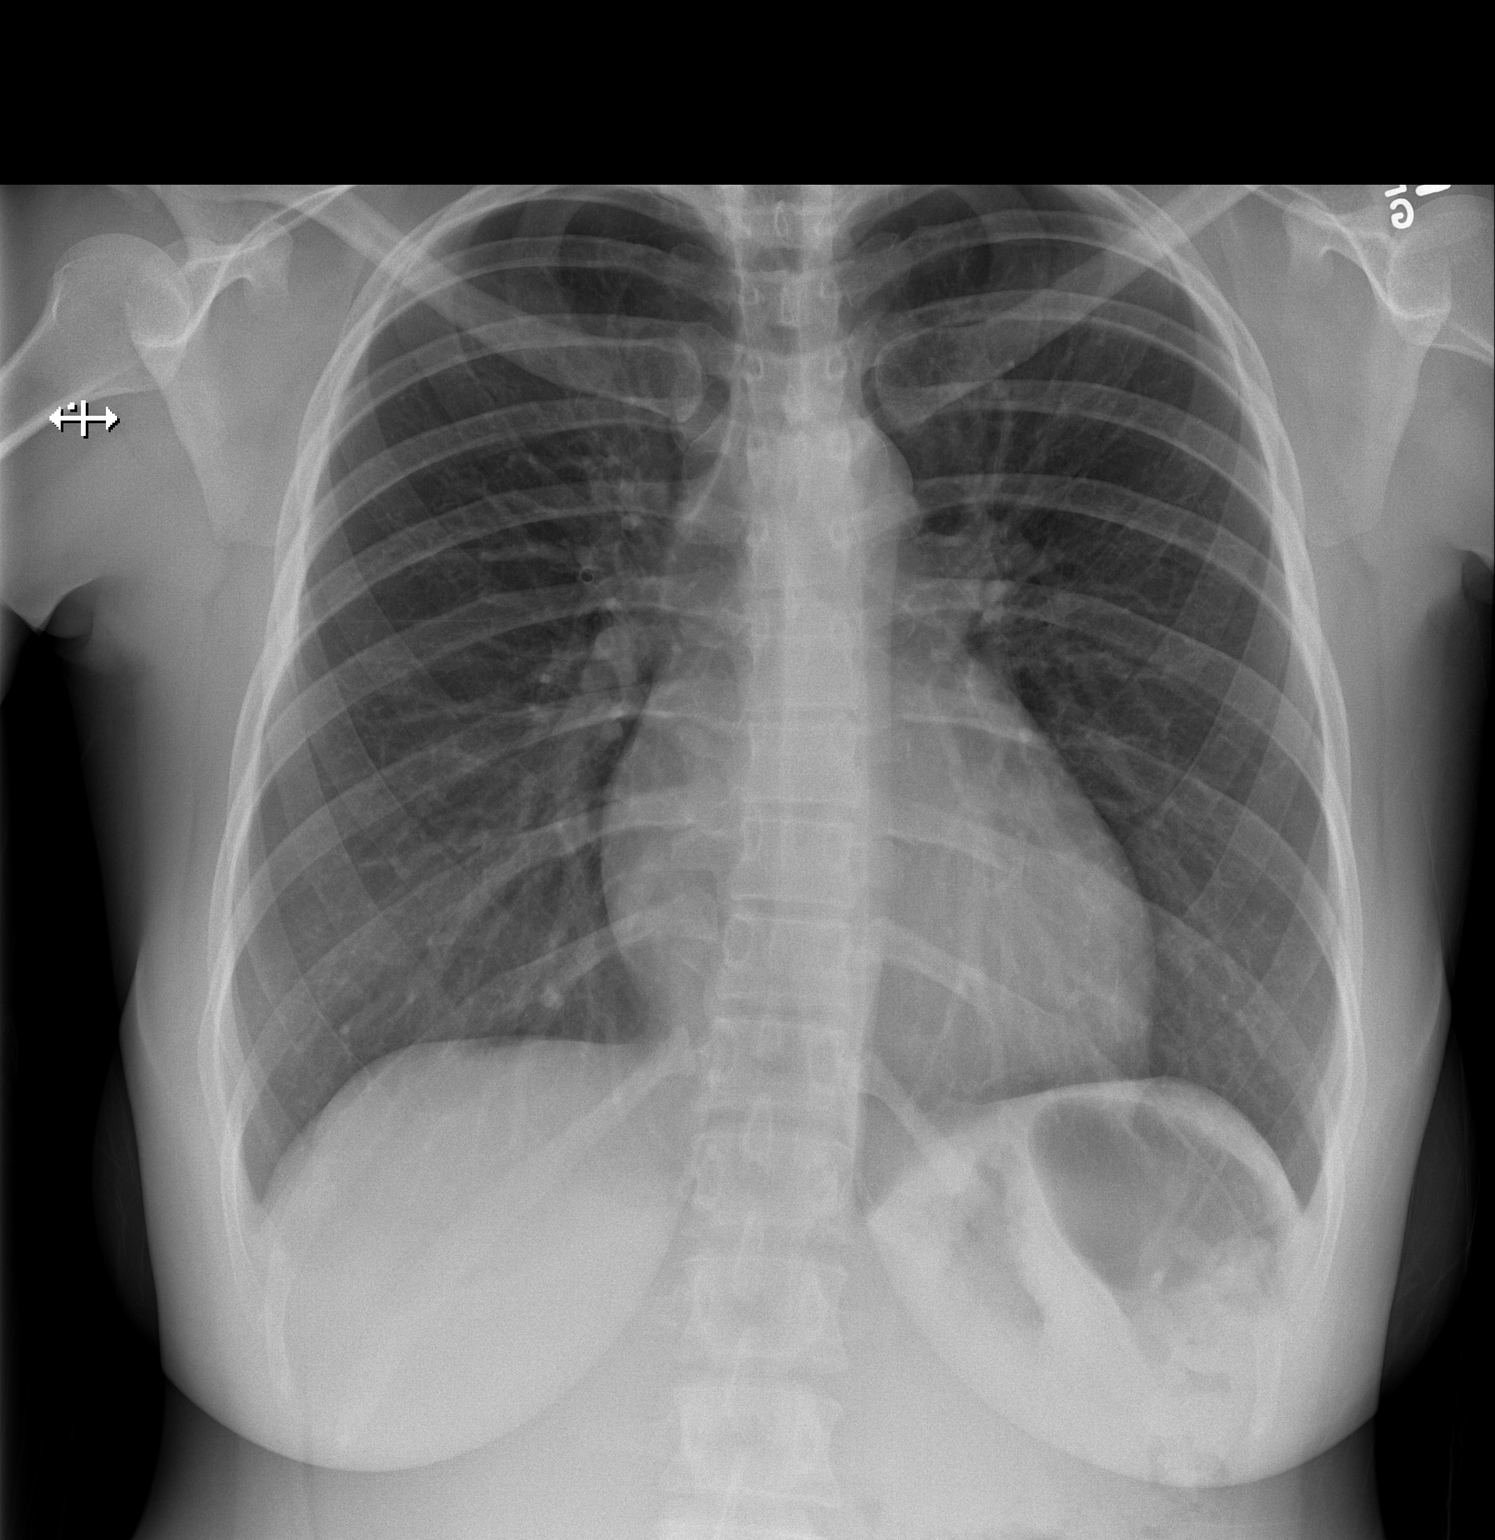

[w chest lat]
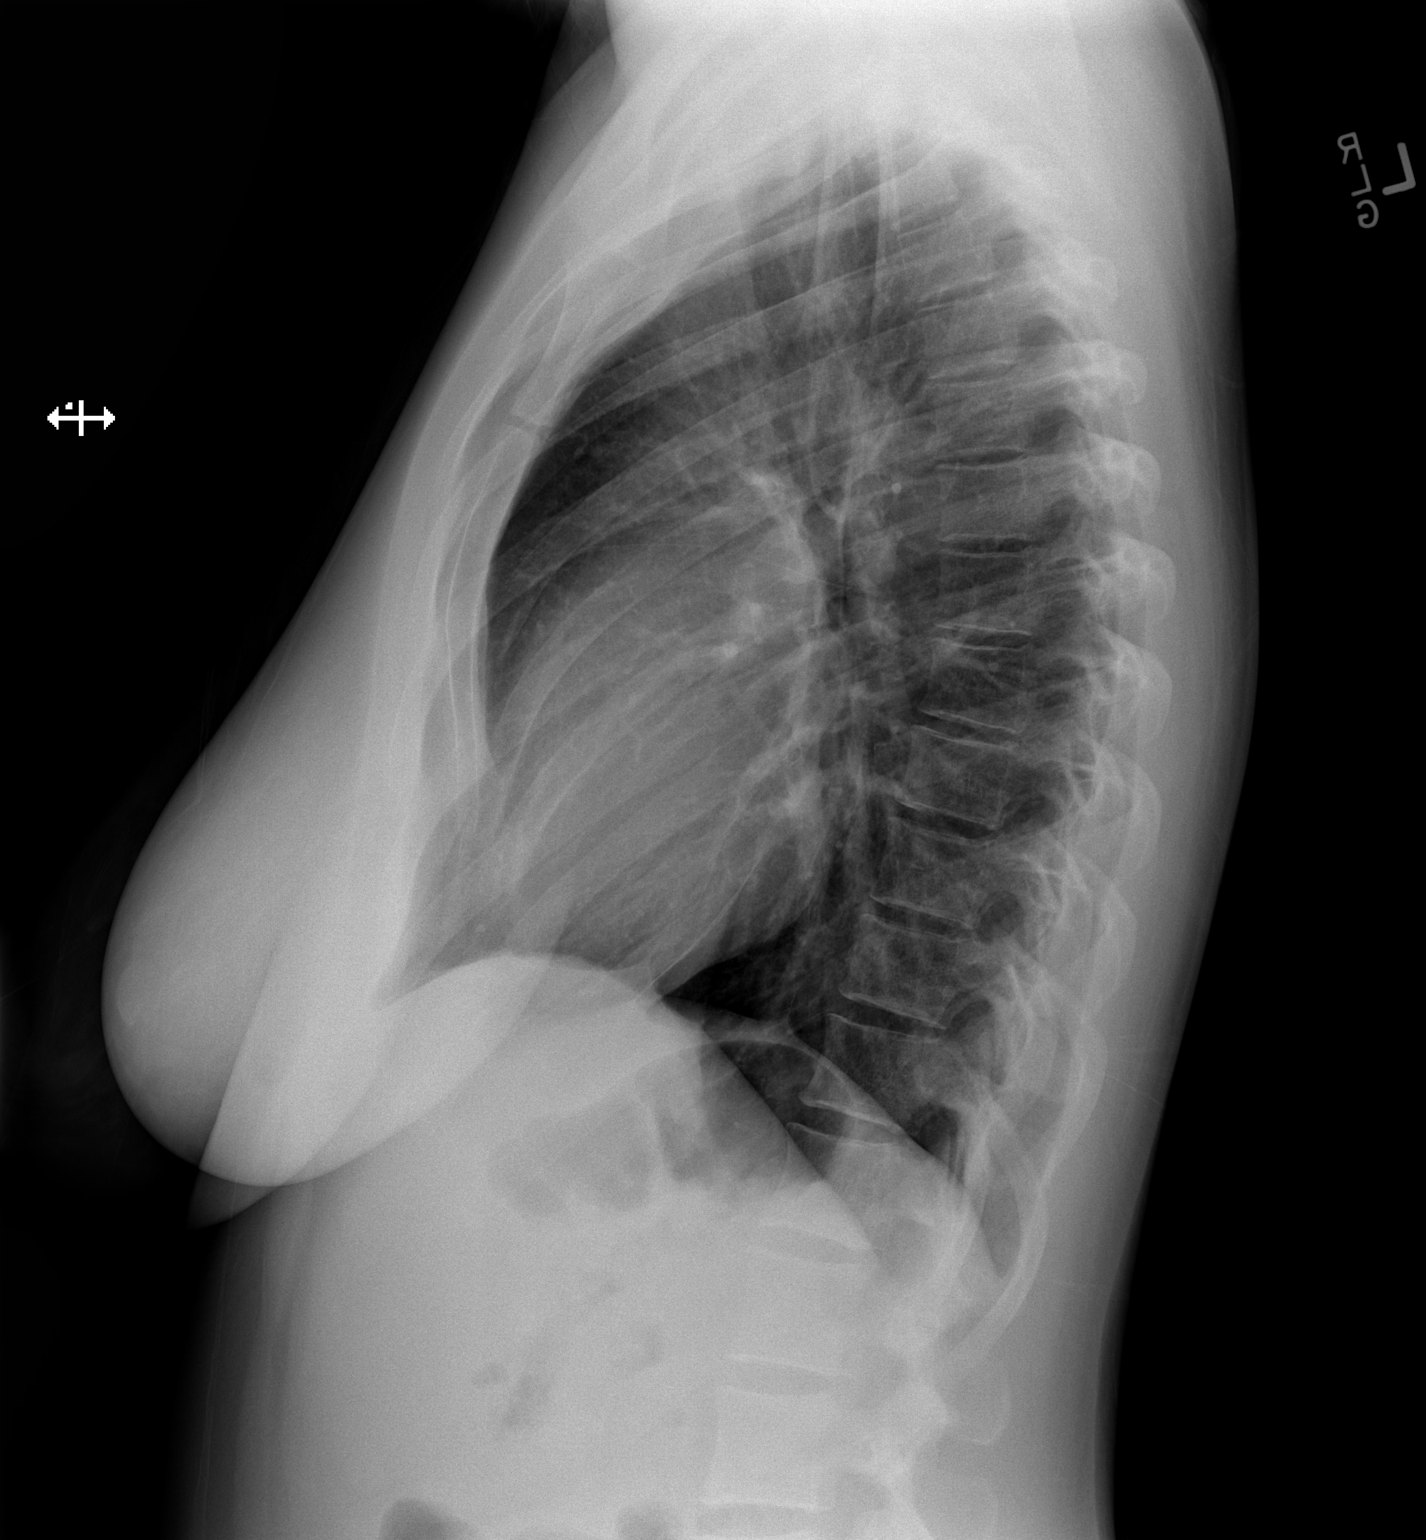

[2 of 2 positions shown; findings below may reference images not displayed]

FINDINGS: Lungs are clear. Heart size and pulmonary vascularity are normal. No
adenopathy. There is pectus excavatum.
IMPRESSION: Pectus excavatum.  No edema or consolidation.

## 2016-01-12 IMAGING — US US PELVIS COMPLETE
1 series · 14 of 25 positions shown · non-contrast
Comparison: 06/03/2011

CLINICAL DATA: Status post therapeutic abortion 4 months ago



[Series 1: us pelvis complete · 14 of 51 slices shown]
[im 1/51]
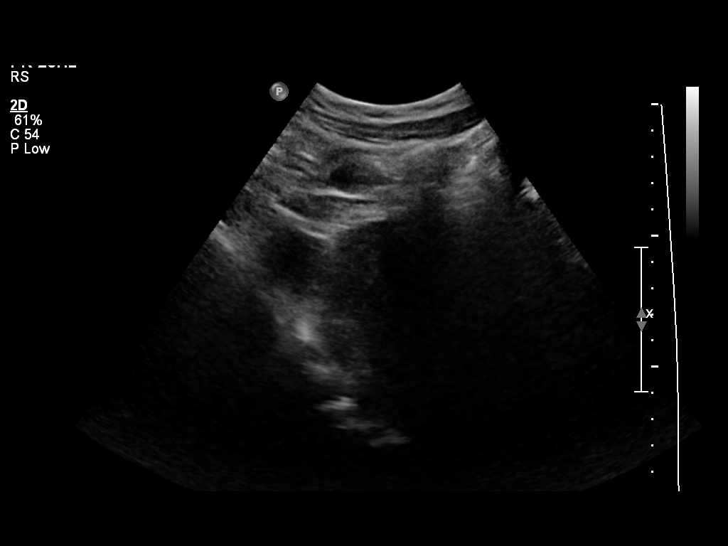
[im 5/51]
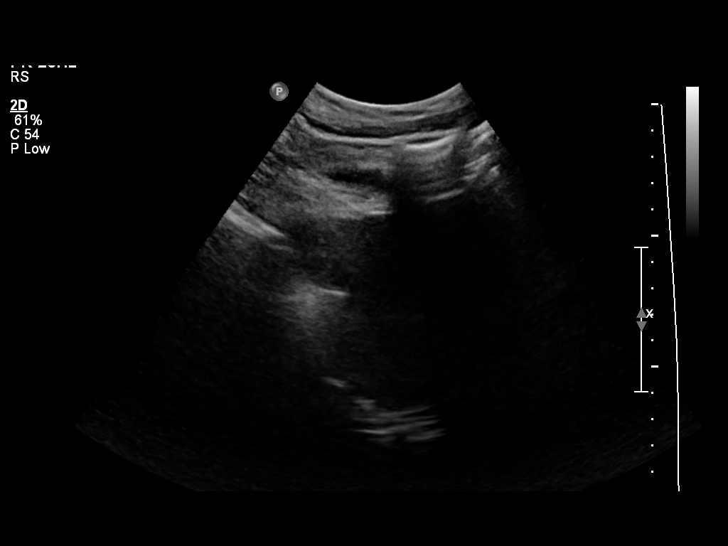
[im 9/51]
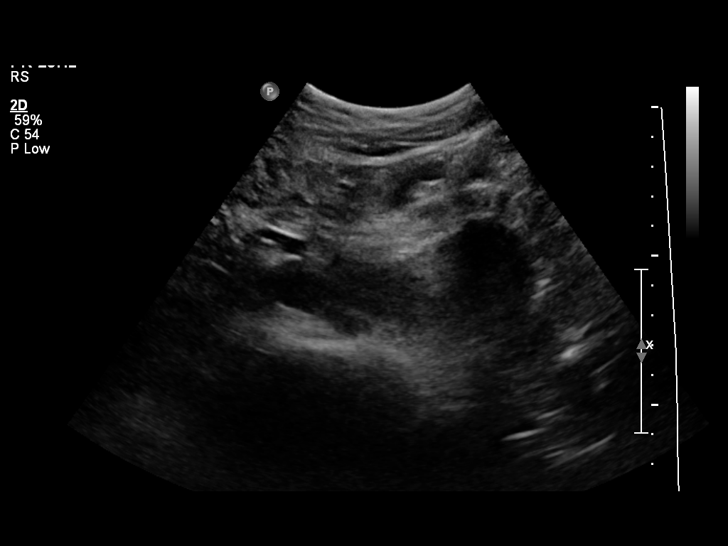
[im 13/51]
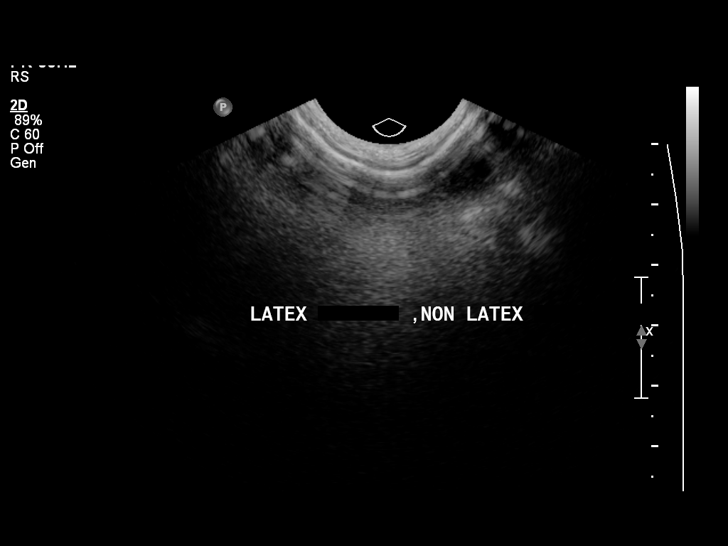
[im 17/51]
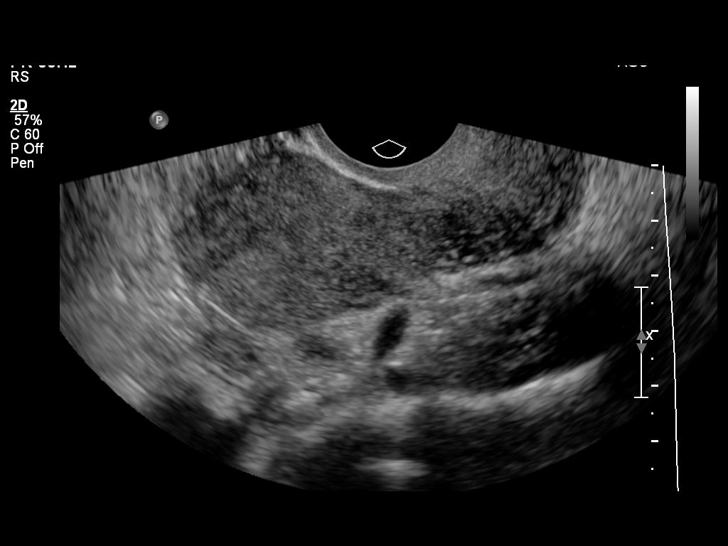
[im 19/51]
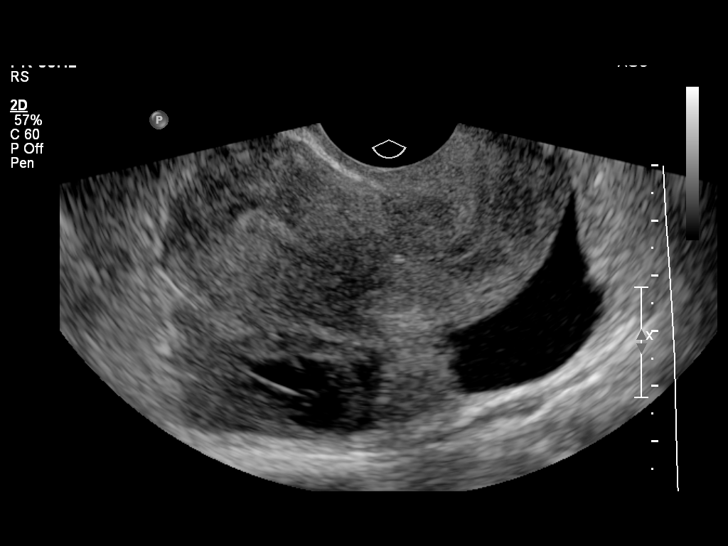
[im 23/51]
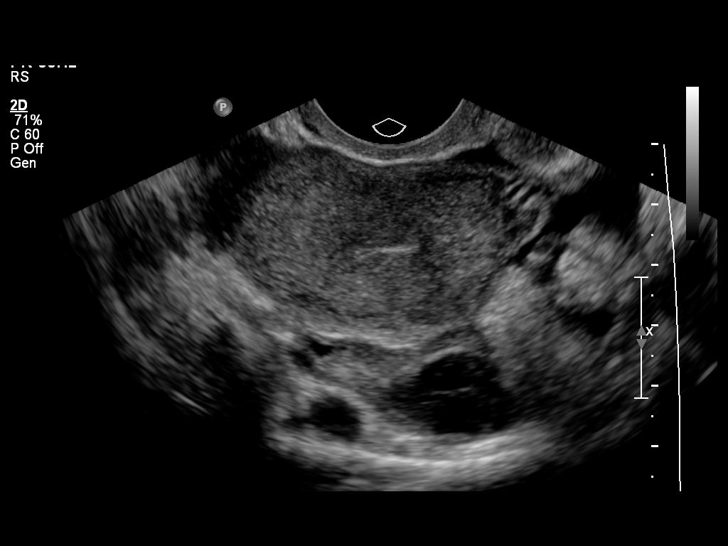
[im 28/51]
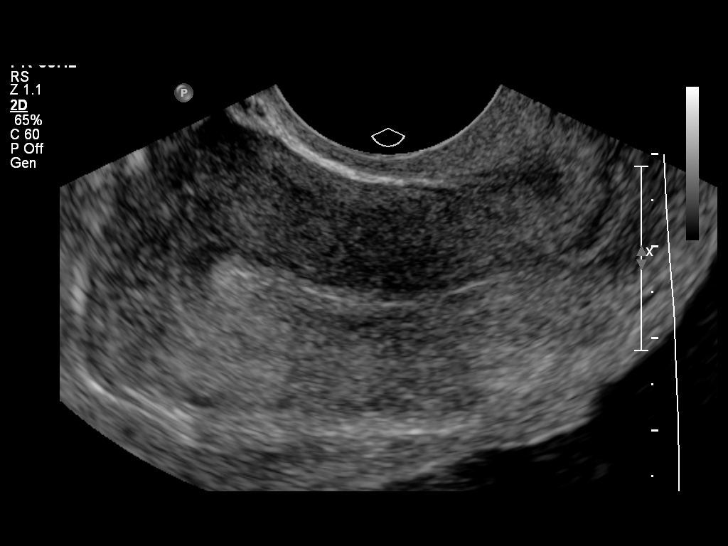
[im 32/51]
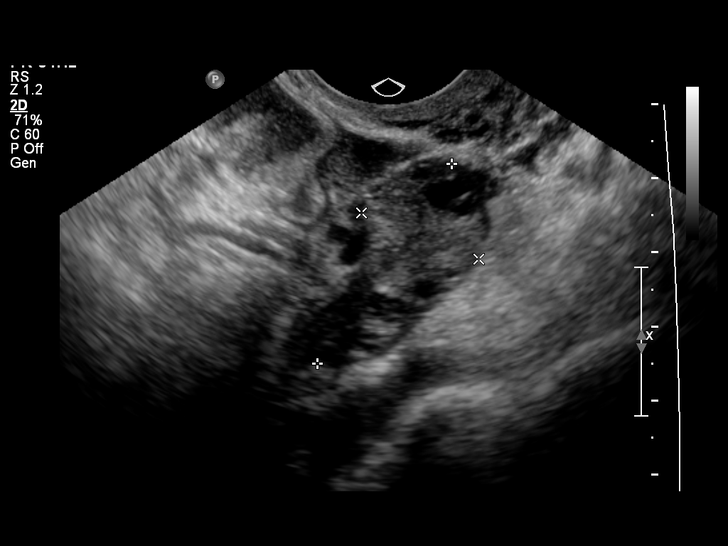
[im 34/51]
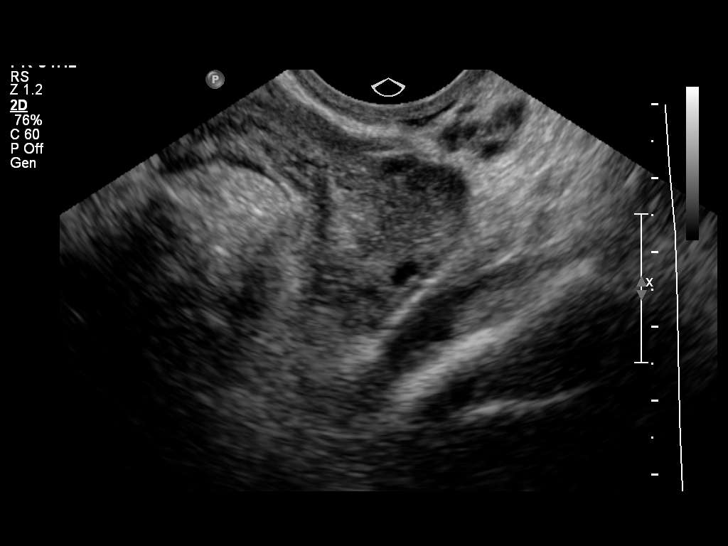
[im 38/51]
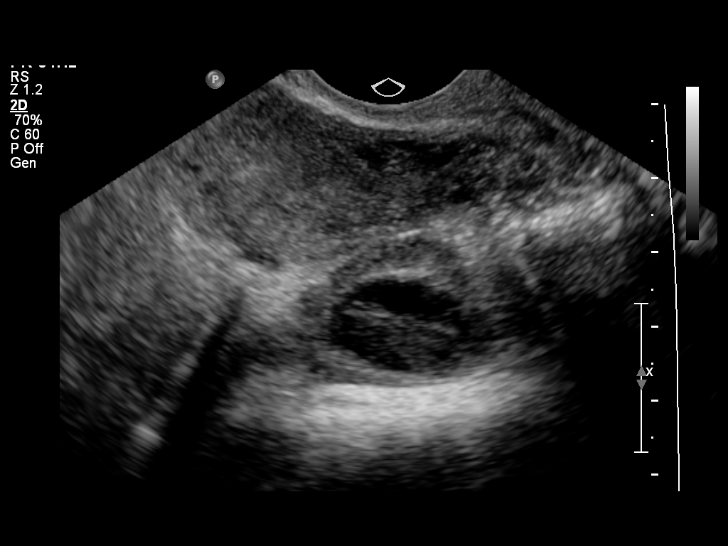
[im 42/51]
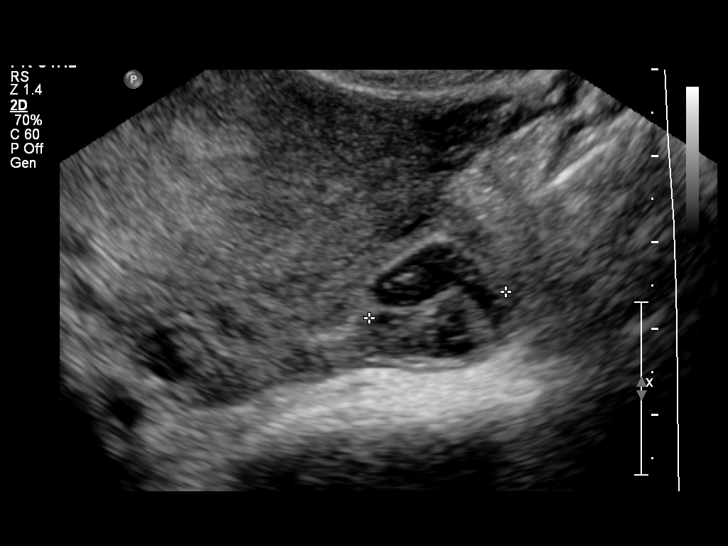
[im 46/51]
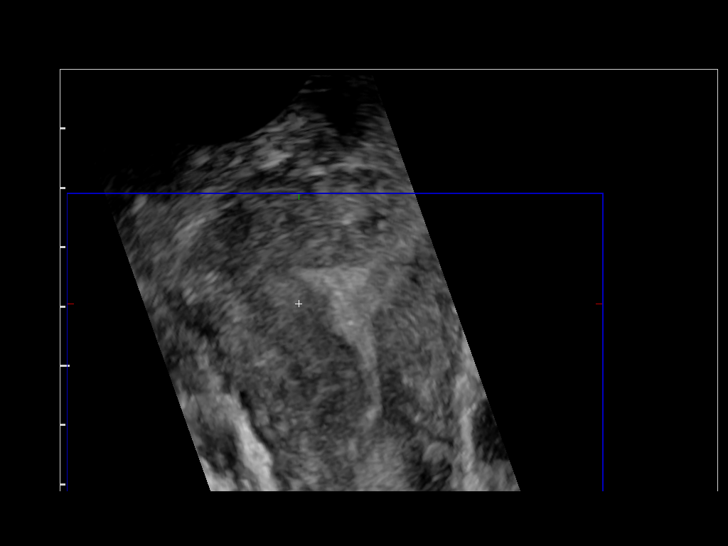
[im 51/51]
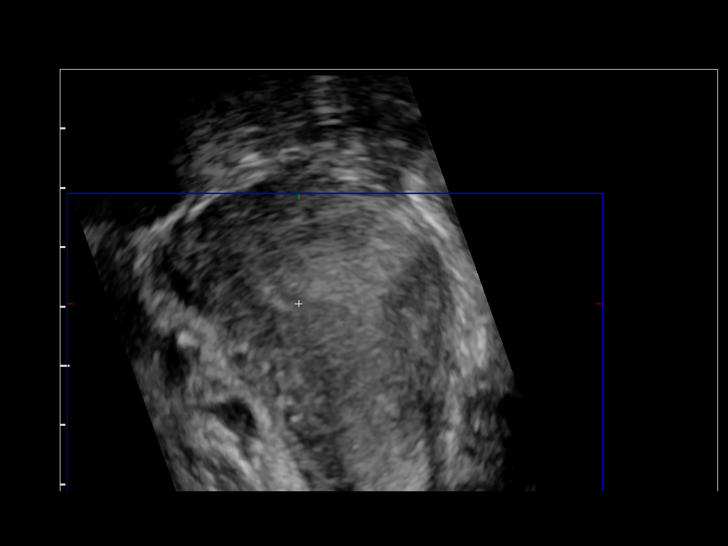

[14 of 25 positions shown; findings below may reference images not displayed]

FINDINGS: Uterus

Measurements: 7.5 x 3.4 x 5.0 cm. No fibroids or other mass
visualized.

Endometrium

Thickness: 4 mm.  No focal abnormality visualized.

Right ovary

Measurements: 32 x 17 x 17 mm. 19 x 13 x 16 mm complex cystic
structure in the right ovary containing a thin internal septation
and some internal echoes.

Left ovary

Measurements: 33 x 17 x 14 mm. Normal appearance/no adnexal mass.

Other findings:  Moderate volume free fluid.
IMPRESSION: Small hemorrhagic cyst right ovary associated with free fluid in the
pelvis. Otherwise negative.

## 2017-02-11 ENCOUNTER — Encounter (HOSPITAL_COMMUNITY): Payer: Self-pay | Admitting: *Deleted

## 2017-02-11 ENCOUNTER — Inpatient Hospital Stay (HOSPITAL_COMMUNITY): Payer: Self-pay

## 2017-02-11 ENCOUNTER — Inpatient Hospital Stay (HOSPITAL_COMMUNITY)
Admission: AD | Admit: 2017-02-11 | Discharge: 2017-02-11 | Disposition: A | Discharge status: Home / Self Care | Payer: Self-pay | Source: Ambulatory Visit | Attending: Obstetrics and Gynecology | Admitting: Obstetrics and Gynecology

## 2017-02-11 DIAGNOSIS — F17210 Nicotine dependence, cigarettes, uncomplicated: Secondary | ICD-10-CM | POA: Insufficient documentation

## 2017-02-11 DIAGNOSIS — D250 Submucous leiomyoma of uterus: Secondary | ICD-10-CM | POA: Insufficient documentation

## 2017-02-11 DIAGNOSIS — N941 Unspecified dyspareunia: Secondary | ICD-10-CM | POA: Insufficient documentation

## 2017-02-11 DIAGNOSIS — R1031 Right lower quadrant pain: Secondary | ICD-10-CM

## 2017-02-11 DIAGNOSIS — N83201 Unspecified ovarian cyst, right side: Secondary | ICD-10-CM | POA: Insufficient documentation

## 2017-02-11 DIAGNOSIS — R102 Pelvic and perineal pain: Secondary | ICD-10-CM | POA: Insufficient documentation

## 2017-02-11 LAB — URINALYSIS, ROUTINE W REFLEX MICROSCOPIC
Bilirubin Urine: NEGATIVE
Glucose, UA: NEGATIVE mg/dL
Hgb urine dipstick: NEGATIVE
KETONES UR: NEGATIVE mg/dL
LEUKOCYTES UA: NEGATIVE
Nitrite: NEGATIVE
Protein, ur: NEGATIVE mg/dL
Specific Gravity, Urine: 1.019 (ref 1.005–1.030)
pH: 6 (ref 5.0–8.0)

## 2017-02-11 LAB — CBC
HCT: 36.6 % (ref 36.0–46.0)
Hemoglobin: 12.4 g/dL (ref 12.0–15.0)
MCH: 31.4 pg (ref 26.0–34.0)
MCHC: 33.9 g/dL (ref 30.0–36.0)
MCV: 92.7 fL (ref 78.0–100.0)
PLATELETS: 176 10*3/uL (ref 150–400)
RBC: 3.95 MIL/uL (ref 3.87–5.11)
RDW: 15 % (ref 11.5–15.5)
WBC: 10.5 10*3/uL (ref 4.0–10.5)

## 2017-02-11 LAB — POCT PREGNANCY, URINE: Preg Test, Ur: NEGATIVE

## 2017-02-11 LAB — WET PREP, GENITAL
Sperm: NONE SEEN
Trich, Wet Prep: NONE SEEN
YEAST WET PREP: NONE SEEN

## 2017-02-11 MED ORDER — TRAMADOL HCL 50 MG PO TABS: 50.0000 mg | ORAL_TABLET | Freq: Four times a day (QID) | ORAL | 0 refills | Status: AC | PRN

## 2017-02-11 NOTE — Progress Notes (Signed)
Fatima Blank CNM in to discuss test results and d/c plan. Written and verbal d/c instructions given and understanding voiced.

## 2017-02-11 NOTE — Discharge Instructions (Signed)
[facility]  ° ° °[facility]       Phone: [phone] ° °[facility] Phone: [phone] ° °[facility]  Phone: [phone] ° °[facility]  Phone: [phone] ° °[facility]  Phone: [phone] ° °[facility]       Phone: [phone] ° °[facility]    Phone: [phone]  ° °[facility]    Phone: [phone] ° °[facility]    Phone: [phone] ° °[facility]    Phone: [phone] ° °[facility]    Phone: [phone] ° °[facility]       Phone: [phone]  ° °[facility]  Phone: [phone] ° °[facility]    Phone: [phone] ° °[facility]   Phone: [phone] ° °[facility]      Phone: [phone] ° °[facility]    Phone: [phone] ° °

## 2017-02-11 NOTE — MAU Provider Note (Signed)
Chief Complaint: Abdominal Pain   None     SUBJECTIVE HPI: Erica Mcclure is a 31 y.o. G2P1011 who presents to maternity admissions reporting pain during intercourse 1 week ago and then again today that was severe and lasted several minutes. The pain is mostly on the right lower side of her abdomen.  She reports some mild soreness continues now but the severe pain resolved within a few minutes after intercourse. She did not try any treatments.  There are no associated symptoms.   She denies vaginal bleeding, discharge with odor, vaginal itching/burning, urinary symptoms, h/a, dizziness, n/v, or fever/chills.     HPI  Past Medical History:  Diagnosis Date  . Asthma    had inhaler in 2011 unsure of name  . Chlamydia   . Endometriosis   . Gonorrhea in female   . No pertinent past medical history   . Trichomoniasis    Past Surgical History:  Procedure Laterality Date  . LAPAROSCOPY  2005  . NO PAST SURGERIES     Social History   Social History  . Marital status: Single    Spouse name: N/A  . Number of children: N/A  . Years of education: N/A   Occupational History  . Not on file.   Social History Main Topics  . Smoking status: Current Some Day Smoker    Types: Cigarettes  . Smokeless tobacco: Never Used  . Alcohol use No  . Drug use: No  . Sexual activity: Yes    Birth control/ protection: None   Other Topics Concern  . Not on file   Social History Narrative  . No narrative on file   No current facility-administered medications on file prior to encounter.    Current Outpatient Prescriptions on File Prior to Encounter  Medication Sig Dispense Refill  . acetaminophen (TYLENOL) 500 MG tablet Take 1,000 mg by mouth every 6 (six) hours as needed for mild pain.    . clindamycin (CLEOCIN) 300 MG capsule Take 1 capsule (300 mg total) by mouth 2 (two) times daily. X 7 days 28 capsule 0  . diphenhydrAMINE (BENADRYL) 25 MG tablet Take 2 tablets (50 mg total) by mouth  every 4 (four) hours as needed for itching. 20 tablet 0  . famotidine (PEPCID) 20 MG tablet Take 1 tablet (20 mg total) by mouth 2 (two) times daily. 6 tablet 0  . Norethindrone Acetate-Ethinyl Estrad-FE (LOESTRIN 24 FE) 1-20 MG-MCG(24) tablet Take 1 tablet by mouth daily. (Patient not taking: Reported on 12/10/2014) 1 Package 3  . predniSONE (DELTASONE) 20 MG tablet 3 tabs po day one, then 2 po daily x 4 days 11 tablet 0   Allergies  Allergen Reactions  . Ibuprofen Other (See Comments)    tachycardia  . Latex Itching    condoms    ROS:  Review of Systems  Constitutional: Negative for chills, fatigue and fever.  Respiratory: Negative for shortness of breath.   Cardiovascular: Negative for chest pain.  Gastrointestinal: Positive for abdominal pain. Negative for constipation, diarrhea, nausea and vomiting.  Genitourinary: Positive for pelvic pain. Negative for difficulty urinating, dysuria, flank pain, vaginal bleeding, vaginal discharge and vaginal pain.  Neurological: Negative for dizziness and headaches.  Psychiatric/Behavioral: Negative.      I have reviewed patient's Past Medical Hx, Surgical Hx, Family Hx, Social Hx, medications and allergies.   Physical Exam   Patient Vitals for the past 24 hrs:  BP Temp Pulse Resp Height Weight  02/11/17 0421 (!) 95/59 -  67 16 - -  02/11/17 0139 107/72 98.1 F (36.7 C) 62 16 5\' 2"  (1.575 m) 135 lb (61.2 kg)   Constitutional: Well-developed, well-nourished female in no acute distress.  Cardiovascular: normal rate Respiratory: normal effort GI: Abd soft, non-tender. Pos BS x 4 MS: Extremities nontender, no edema, normal ROM Neurologic: Alert and oriented x 4.  GU: Neg CVAT.  PELVIC EXAM: Cervix pink, visually closed, without lesion, scant white creamy discharge, vaginal walls and external genitalia normal Bimanual exam: Cervix 0/long/high, firm, anterior, neg CMT, uterus nontender, nonenlarged, adnexa without tenderness, enlargement, or  mass   LAB RESULTS Results for orders placed or performed during the hospital encounter of 02/11/17 (from the past 24 hour(s))  Urinalysis, Routine w reflex microscopic     Status: None   Collection Time: 02/11/17  1:45 AM  Result Value Ref Range   Color, Urine YELLOW YELLOW   APPearance CLEAR CLEAR   Specific Gravity, Urine 1.019 1.005 - 1.030   pH 6.0 5.0 - 8.0   Glucose, UA NEGATIVE NEGATIVE mg/dL   Hgb urine dipstick NEGATIVE NEGATIVE   Bilirubin Urine NEGATIVE NEGATIVE   Ketones, ur NEGATIVE NEGATIVE mg/dL   Protein, ur NEGATIVE NEGATIVE mg/dL   Nitrite NEGATIVE NEGATIVE   Leukocytes, UA NEGATIVE NEGATIVE  Pregnancy, urine POC     Status: None   Collection Time: 02/11/17  1:57 AM  Result Value Ref Range   Preg Test, Ur NEGATIVE NEGATIVE  Wet prep, genital     Status: Abnormal   Collection Time: 02/11/17  2:29 AM  Result Value Ref Range   Yeast Wet Prep HPF POC NONE SEEN NONE SEEN   Trich, Wet Prep NONE SEEN NONE SEEN   Clue Cells Wet Prep HPF POC PRESENT (A) NONE SEEN   WBC, Wet Prep HPF POC MODERATE (A) NONE SEEN   Sperm NONE SEEN   CBC     Status: None   Collection Time: 02/11/17  2:37 AM  Result Value Ref Range   WBC 10.5 4.0 - 10.5 K/uL   RBC 3.95 3.87 - 5.11 MIL/uL   Hemoglobin 12.4 12.0 - 15.0 g/dL   HCT 36.6 36.0 - 46.0 %   MCV 92.7 78.0 - 100.0 fL   MCH 31.4 26.0 - 34.0 pg   MCHC 33.9 30.0 - 36.0 g/dL   RDW 15.0 11.5 - 15.5 %   Platelets 176 150 - 400 K/uL       IMAGING US Transvaginal Non-ob  Result Date: 02/11/2017 CLINICAL DATA:  Acute onset of right lower quadrant abdominal pain during intercourse. Dyspareunia. Initial encounter. EXAM: TRANSABDOMINAL AND TRANSVAGINAL ULTRASOUND OF PELVIS TECHNIQUE: Both transabdominal and transvaginal ultrasound examinations of the pelvis were performed. Transabdominal technique was performed for global imaging of the pelvis including uterus, ovaries, adnexal regions, and pelvic cul-de-sac. It was necessary to  proceed with endovaginal exam following the transabdominal exam to visualize the endometrium and ovaries in greater detail. COMPARISON:  Pelvic ultrasound performed 04/25/2014 FINDINGS: Uterus Measurements: 8.8 x 4.6 x 5.5 cm. An apparent small submucosal fibroid or partial septum is suggested near the fundus of the uterus, dividing the endometrial echo complex. Endometrium Thickness: 0.6 cm.  No other focal abnormality visualized. Right ovary Measurements: 3.6 x 2.3 x 2.8 cm. A small complex 2.8 x 1.7 x 1.4 cm cyst may reflect a small organized hemorrhagic cyst. Left ovary Measurements: 2.3 x 2.1 x 2.1 cm. Normal appearance/no adnexal mass. Other findings A small amount of free fluid is noted within the  pelvis. IMPRESSION: 1. Question of small submucosal fibroid or partial septum near the fundus of the uterus, dividing the endometrial echo complex. Uterus otherwise unremarkable in appearance. 2. Small complex 2.8 cm cyst at the right ovary may reflect a small organized hemorrhagic cyst. 3. No evidence for ovarian torsion. Electronically Signed   By: Garald Balding M.D.   On: 02/11/2017 05:29   US Pelvis Complete  Result Date: 02/11/2017 CLINICAL DATA:  Acute onset of right lower quadrant abdominal pain during intercourse. Dyspareunia. Initial encounter. EXAM: TRANSABDOMINAL AND TRANSVAGINAL ULTRASOUND OF PELVIS TECHNIQUE: Both transabdominal and transvaginal ultrasound examinations of the pelvis were performed. Transabdominal technique was performed for global imaging of the pelvis including uterus, ovaries, adnexal regions, and pelvic cul-de-sac. It was necessary to proceed with endovaginal exam following the transabdominal exam to visualize the endometrium and ovaries in greater detail. COMPARISON:  Pelvic ultrasound performed 04/25/2014 FINDINGS: Uterus Measurements: 8.8 x 4.6 x 5.5 cm. An apparent small submucosal fibroid or partial septum is suggested near the fundus of the uterus, dividing the endometrial  echo complex. Endometrium Thickness: 0.6 cm.  No other focal abnormality visualized. Right ovary Measurements: 3.6 x 2.3 x 2.8 cm. A small complex 2.8 x 1.7 x 1.4 cm cyst may reflect a small organized hemorrhagic cyst. Left ovary Measurements: 2.3 x 2.1 x 2.1 cm. Normal appearance/no adnexal mass. Other findings A small amount of free fluid is noted within the pelvis. IMPRESSION: 1. Question of small submucosal fibroid or partial septum near the fundus of the uterus, dividing the endometrial echo complex. Uterus otherwise unremarkable in appearance. 2. Small complex 2.8 cm cyst at the right ovary may reflect a small organized hemorrhagic cyst. 3. No evidence for ovarian torsion. Electronically Signed   By: Garald Balding M.D.   On: 02/11/2017 05:29    MAU Management/MDM: Ordered labs and [place] and reviewed results.  No evidence of ovarian torsion but small  Hemorrhagic cyst on right ovary likely source of pt pain.  Small fibroid may contribute but may be incidental. Cyst will drain and no further treatment is needed unless symptoms worsen.  Pt allergic to NSAIDs so will prescribe Tramadol 50 mg Q 6 hours PRN x 15 tabs.  Pt to f/u with [facility] or Gyn provider of pt choice.  Return to MAU as needed for emergencies.  Pt stable at time of discharge.  ASSESSMENT 1. RLQ abdominal pain   2. Acute pelvic pain, female   3. Hemorrhagic cyst of right ovary   4. Submucous leiomyoma of uterus   5. Dyspareunia in female     PLAN Discharge home Allergies as of 02/11/2017      Reactions   Ibuprofen Other (See Comments)   tachycardia   Latex Itching   condoms      Medication List    STOP taking these medications   clindamycin 300 MG capsule Commonly known as:  CLEOCIN   diphenhydrAMINE 25 MG tablet Commonly known as:  BENADRYL   famotidine 20 MG tablet Commonly known as:  PEPCID   Norethindrone Acetate-Ethinyl Estrad-FE 1-20 MG-MCG(24) tablet Commonly known as:  LOESTRIN 24 FE   predniSONE 20 MG  tablet Commonly known as:  DELTASONE     TAKE these medications   acetaminophen 500 MG tablet Commonly known as:  TYLENOL Take 1,000 mg by mouth every 6 (six) hours as needed for mild pain.   traMADol 50 MG tablet Commonly known as:  ULTRAM Take 1 tablet (50 mg total) by mouth every 6 (  six) hours as needed.      Follow-up [facility] for [facility] Follow up.   Specialty:  Obstetrics and Gynecology Why:  Or Gyn provider of your choice, see list provided.  Return to MAU as needed for emergencies. Contact information: [place] Certified Nurse-Midwife 02/11/2017  6:26 AM

## 2017-02-11 NOTE — MAU Note (Signed)
Having issues with intercourse. Causes a lot of pain when we have intercourse. Very sharp pain in my cerivx and lower abdomen. No vag bleeding. Hx endometriosis. Took upt yesterday and was negative. Hx fibroids as well

## 2017-02-13 ENCOUNTER — Telehealth: Payer: Self-pay | Admitting: Obstetrics and Gynecology

## 2017-02-13 ENCOUNTER — Telehealth (HOSPITAL_COMMUNITY): Payer: Self-pay

## 2017-02-13 LAB — GC/CHLAMYDIA PROBE AMP (~~LOC~~) NOT AT ARMC
CHLAMYDIA, DNA PROBE: POSITIVE — AB
NEISSERIA GONORRHEA: NEGATIVE

## 2017-02-13 MED ORDER — AZITHROMYCIN 500 MG PO TABS: 1000.0000 mg | ORAL_TABLET | Freq: Once | ORAL | 0 refills | Status: AC

## 2017-02-13 NOTE — Telephone Encounter (Signed)
Called patient, and verified DOB. Pt aware her lab results during her last visit to [facility] showed she was positive for Chlamydia. Verified pharmacy with patient. Pt aware a prescription (Azithromycin 1 gram x 1 dose) will be called into her pharmacy for treatment of her positive results. She will take the pills as directed. Patient aware once treatment is received, abstain from intercourse x 2 weeks. Also, she is aware to inform her partner because they will also need to be treated. Communicable Disease Report faxed to health department.

## 2017-02-13 NOTE — Telephone Encounter (Signed)
Contacted by nursing informed of postive CT chart. Will treat with zithromax.

## 2018-10-31 IMAGING — US US PELVIS COMPLETE
1 series · 15 of 25 positions shown · non-contrast
Comparison: Pelvic ultrasound performed 04/25/2014

CLINICAL DATA: Acute onset of right lower quadrant abdominal pain
during intercourse. Dyspareunia. Initial encounter.

EXAM:
TRANSABDOMINAL AND TRANSVAGINAL ULTRASOUND OF PELVIS
TECHNIQUE: Both transabdominal and transvaginal ultrasound examinations of the
pelvis were performed. Transabdominal technique was performed for
global imaging of the pelvis including uterus, ovaries, adnexal
regions, and pelvic cul-de-sac. It was necessary to proceed with
endovaginal exam following the transabdominal exam to visualize the
endometrium and ovaries in greater detail.

[Series 1: us pelvis complete · 15 of 123 slices shown]
[im 1/123]
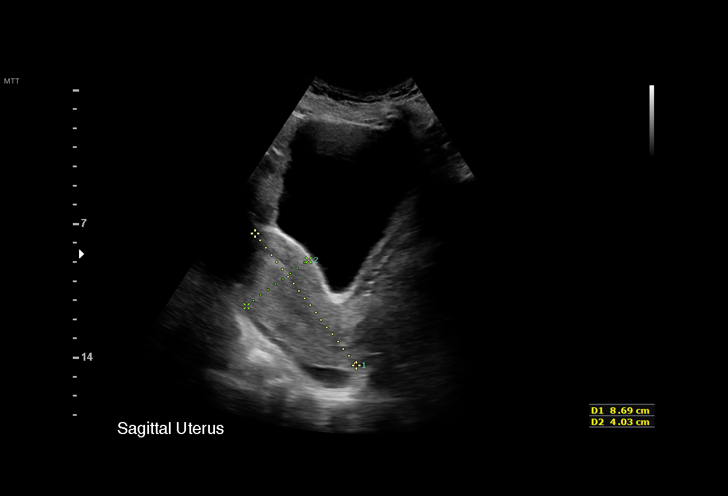
[im 11/123]
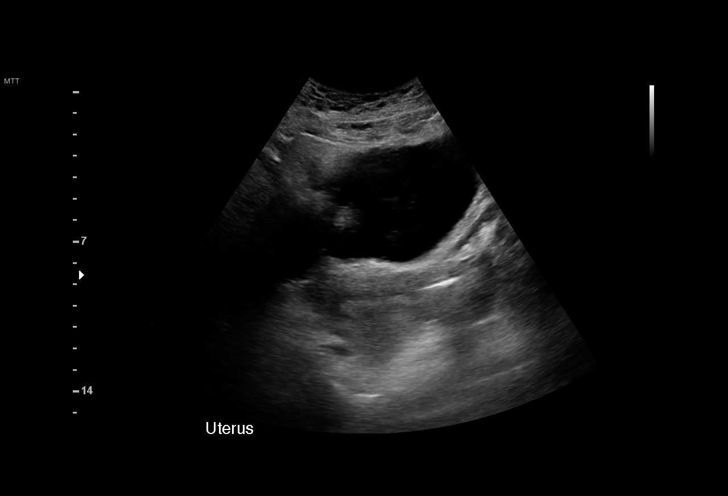
[im 21/123]
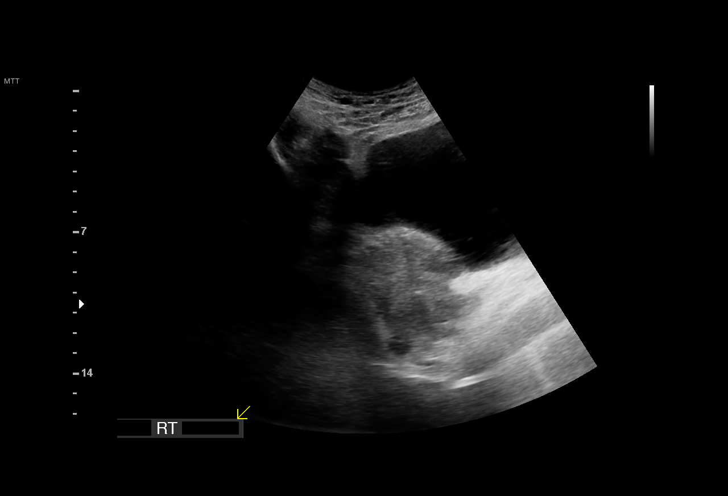
[im 26/123]
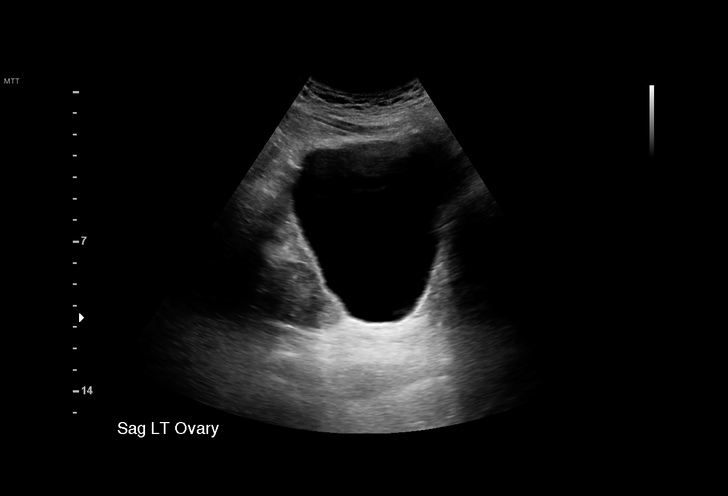
[im 36/123]
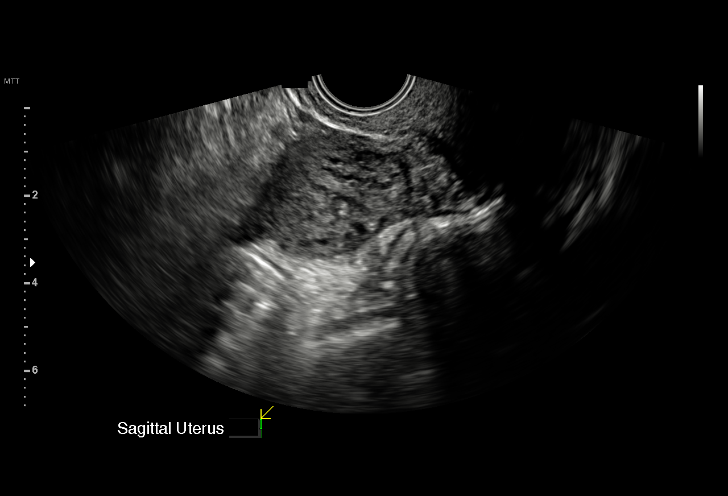
[im 46/123]
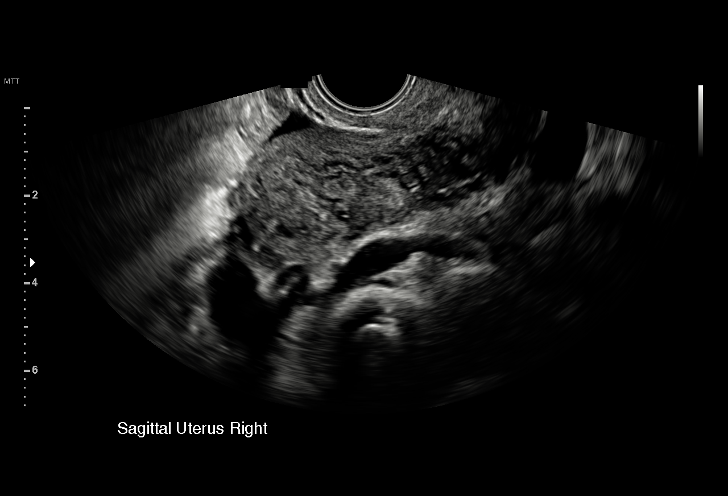
[im 51/123]
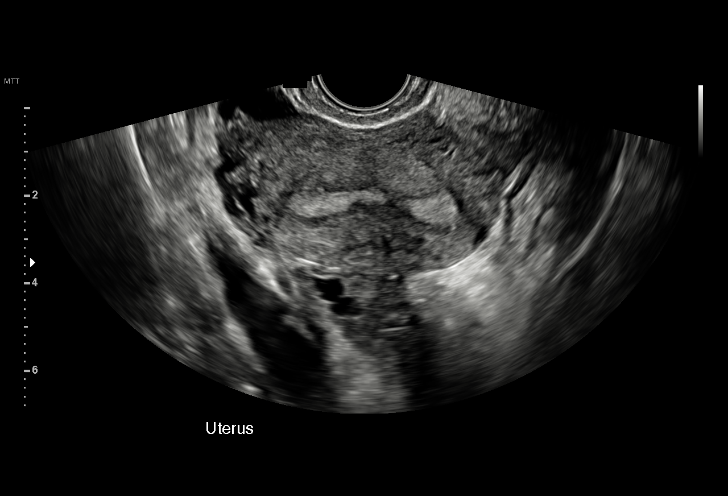
[im 62/123]
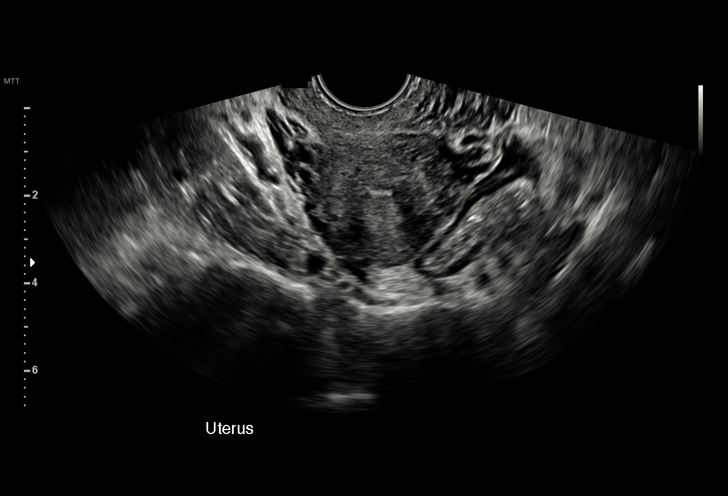
[im 72/123]
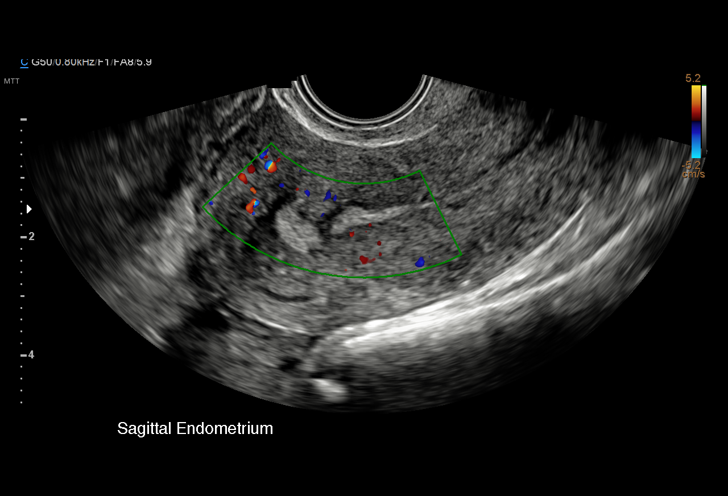
[im 77/123]
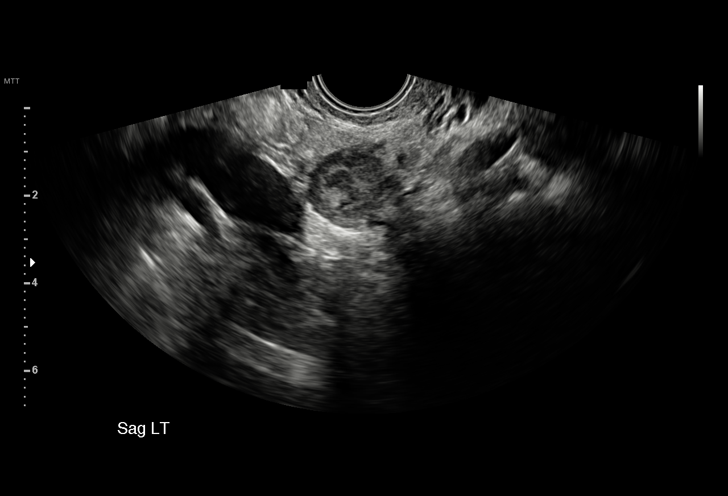
[im 87/123]
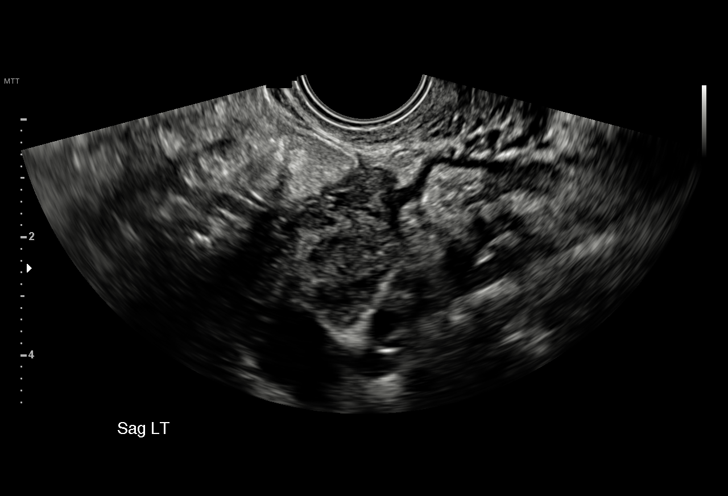
[im 97/123]
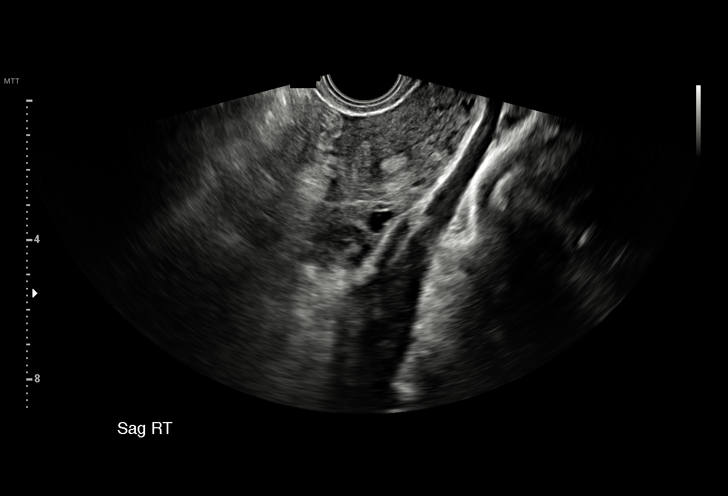
[im 102/123]
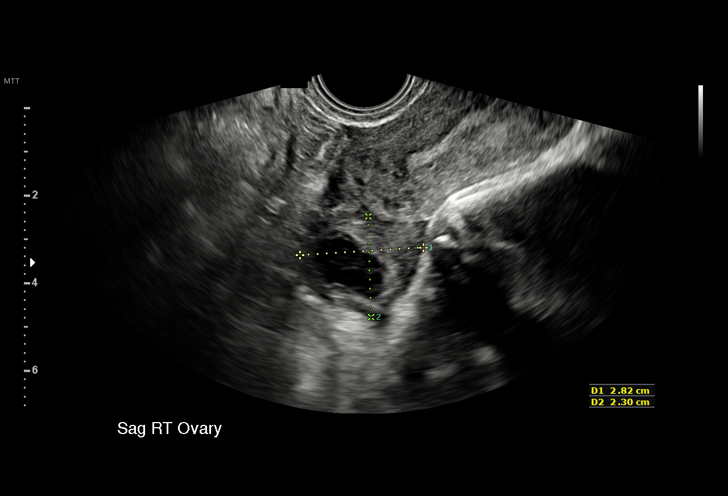
[im 112/123]
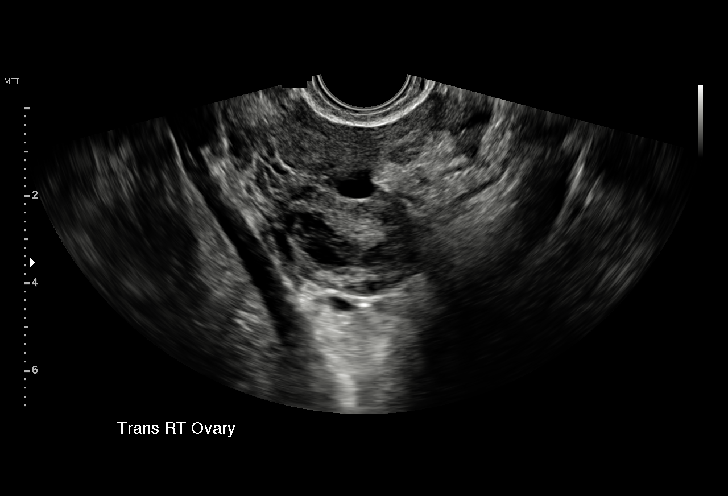
[im 123/123]
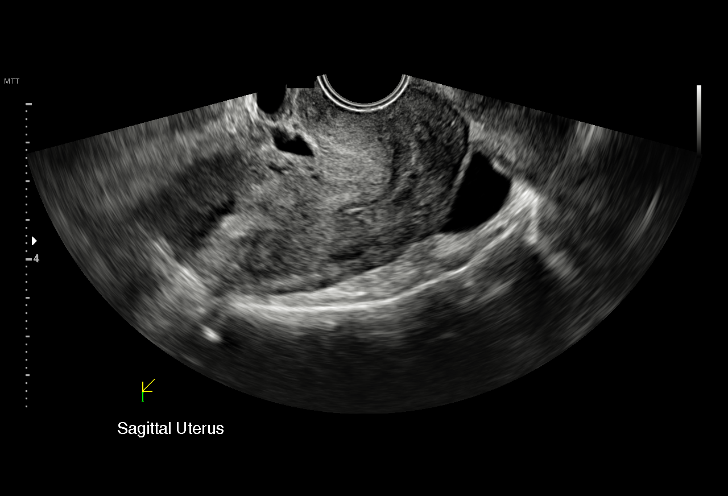

[15 of 25 positions shown; findings below may reference images not displayed]

FINDINGS: Uterus

Measurements: 8.8 x 4.6 x 5.5 cm. An apparent small submucosal
fibroid or partial septum is suggested near the fundus of the
uterus, dividing the endometrial echo complex.

Endometrium

Thickness: 0.6 cm.  No other focal abnormality visualized.

Right ovary

Measurements: 3.6 x 2.3 x 2.8 cm. A small complex 2.8 x 1.7 x 1.4 cm
cyst may reflect a small organized hemorrhagic cyst.

Left ovary

Measurements: 2.3 x 2.1 x 2.1 cm. Normal appearance/no adnexal mass.

Other findings

A small amount of free fluid is noted within the pelvis.
IMPRESSION: 1. Question of small submucosal fibroid or partial septum near the
fundus of the uterus, dividing the endometrial echo complex. Uterus
otherwise unremarkable in appearance.
2. Small complex 2.8 cm cyst at the right ovary may reflect a small
organized hemorrhagic cyst.
3. No evidence for ovarian torsion.

## 2020-08-18 ENCOUNTER — Other Ambulatory Visit: Payer: Self-pay

## 2020-08-18 ENCOUNTER — Inpatient Hospital Stay (HOSPITAL_COMMUNITY): Payer: Managed Care, Other (non HMO)

## 2020-08-18 ENCOUNTER — Inpatient Hospital Stay (HOSPITAL_COMMUNITY)
Admission: AD | Admit: 2020-08-18 | Discharge: 2020-08-18 | Disposition: A | Discharge status: Home / Self Care | Payer: Managed Care, Other (non HMO) | Attending: Obstetrics and Gynecology | Admitting: Obstetrics and Gynecology

## 2020-08-18 ENCOUNTER — Encounter (HOSPITAL_COMMUNITY): Payer: Self-pay | Admitting: Obstetrics and Gynecology

## 2020-08-18 DIAGNOSIS — O99331 Smoking (tobacco) complicating pregnancy, first trimester: Secondary | ICD-10-CM | POA: Diagnosis not present

## 2020-08-18 DIAGNOSIS — F17210 Nicotine dependence, cigarettes, uncomplicated: Secondary | ICD-10-CM | POA: Diagnosis not present

## 2020-08-18 DIAGNOSIS — O208 Other hemorrhage in early pregnancy: Secondary | ICD-10-CM | POA: Diagnosis not present

## 2020-08-18 DIAGNOSIS — O4690 Antepartum hemorrhage, unspecified, unspecified trimester: Secondary | ICD-10-CM

## 2020-08-18 DIAGNOSIS — Z3A01 Less than 8 weeks gestation of pregnancy: Secondary | ICD-10-CM | POA: Diagnosis not present

## 2020-08-18 DIAGNOSIS — Z3490 Encounter for supervision of normal pregnancy, unspecified, unspecified trimester: Secondary | ICD-10-CM

## 2020-08-18 DIAGNOSIS — O468X1 Other antepartum hemorrhage, first trimester: Secondary | ICD-10-CM

## 2020-08-18 DIAGNOSIS — Z6790 Unspecified blood type, Rh positive: Secondary | ICD-10-CM

## 2020-08-18 DIAGNOSIS — O418X10 Other specified disorders of amniotic fluid and membranes, first trimester, not applicable or unspecified: Secondary | ICD-10-CM

## 2020-08-18 LAB — URINALYSIS, ROUTINE W REFLEX MICROSCOPIC
Bilirubin Urine: NEGATIVE
Glucose, UA: NEGATIVE mg/dL
Ketones, ur: NEGATIVE mg/dL
Leukocytes,Ua: NEGATIVE
Nitrite: NEGATIVE
Protein, ur: 30 mg/dL — AB
RBC / HPF: >50 RBC/hpf — ABNORMAL HIGH (ref 0–5)
Specific Gravity, Urine: 1.023 (ref 1.005–1.030)
pH: 7 (ref 5.0–8.0)

## 2020-08-18 LAB — COMPREHENSIVE METABOLIC PANEL
ALT: 13 U/L (ref 0–44)
AST: 17 U/L (ref 15–41)
Albumin: 3.7 g/dL (ref 3.5–5.0)
Alkaline Phosphatase: 33 U/L — ABNORMAL LOW (ref 38–126)
Anion gap: 8 (ref 5–15)
BUN: 7 mg/dL (ref 6–20)
CO2: 26 mmol/L (ref 22–32)
Calcium: 9.3 mg/dL (ref 8.9–10.3)
Chloride: 103 mmol/L (ref 98–111)
Creatinine, Ser: 0.58 mg/dL (ref 0.44–1.00)
GFR, Estimated: >60 mL/min (ref >60)
Glucose, Bld: 76 mg/dL (ref 70–99)
Potassium: 4.1 mmol/L (ref 3.5–5.1)
Sodium: 137 mmol/L (ref 135–145)
Total Bilirubin: 0.5 mg/dL (ref 0.3–1.2)
Total Protein: 6.6 g/dL (ref 6.5–8.1)

## 2020-08-18 LAB — CBC
HCT: 37.5 % (ref 36.0–46.0)
Hemoglobin: 12.9 g/dL (ref 12.0–15.0)
MCH: 32.6 pg (ref 26.0–34.0)
MCHC: 34.4 g/dL (ref 30.0–36.0)
MCV: 94.7 fL (ref 80.0–100.0)
Platelets: 202 10*3/uL (ref 150–400)
RBC: 3.96 MIL/uL (ref 3.87–5.11)
RDW: 14.5 % (ref 11.5–15.5)
WBC: 9.5 10*3/uL (ref 4.0–10.5)
nRBC: 0 % (ref 0.0–0.2)

## 2020-08-18 LAB — HCG, QUANTITATIVE, PREGNANCY: hCG, Beta Chain, Quant, S: 16165 m[IU]/mL — ABNORMAL HIGH (ref <5)

## 2020-08-18 LAB — WET PREP, GENITAL
Clue Cells Wet Prep HPF POC: NONE SEEN
Sperm: NONE SEEN
Trich, Wet Prep: NONE SEEN
Yeast Wet Prep HPF POC: NONE SEEN

## 2020-08-18 LAB — ABO/RH: ABO/RH(D): O POS

## 2020-08-18 LAB — POCT PREGNANCY, URINE: Preg Test, Ur: POSITIVE — AB

## 2020-08-18 NOTE — Discharge Instructions (Signed)
Subchorionic Hematoma  A subchorionic hematoma is a gathering of blood between the outer wall of the embryo (chorion) and the inner wall of the womb (uterus). This condition can cause vaginal bleeding. If they cause little or no vaginal bleeding, early small hematomas usually shrink on their own and do not affect your baby or pregnancy. When bleeding starts later in pregnancy, or if the hematoma is larger or occurs in older pregnant women, the condition may be more serious. Larger hematomas may get bigger, which increases the chances of miscarriage. This condition also increases the risk of:  Premature separation of the placenta from the uterus.  Premature (preterm) labor.  Stillbirth. What are the causes? The exact cause of this condition is not known. It occurs when blood is trapped between the placenta and the uterine wall because the placenta has separated from the original site of implantation. What increases the risk? You are more likely to develop this condition if:  You were treated with fertility medicines.  You conceived through in vitro fertilization (IVF). What are the signs or symptoms? Symptoms of this condition include:  Vaginal spotting or bleeding.  Contractions of the uterus. These cause abdominal pain. Sometimes you may have no symptoms and the bleeding may only be seen when ultrasound images are taken (transvaginal ultrasound). How is this diagnosed? This condition is diagnosed based on a physical exam. This includes a pelvic exam. You may also have other tests, including:  Blood tests.  Urine tests.  Ultrasound of the abdomen. How is this treated? Treatment for this condition can vary. Treatment may include:  Watchful waiting. You will be monitored closely for any changes in bleeding. During this stage: ? The hematoma may be reabsorbed by the body. ? The hematoma may separate the fluid-filled space containing the embryo (gestational sac) from the wall of the  womb (endometrium).  Medicines.  Activity restriction. This may be needed until the bleeding stops. Follow these instructions at home:  Stay on bed rest if told to do so by your health care provider.  Do not lift anything that is heavier than 10 lbs. (4.5 kg) or as told by your health care provider.  Do not use any products that contain nicotine or tobacco, such as cigarettes and e-cigarettes. If you need help quitting, ask your health care provider.  Track and write down the number of pads you use each day and how soaked (saturated) they are.  Do not use tampons.  Keep all follow-up visits as told by your health care provider. This is important. Your health care provider may ask you to have follow-up blood tests or ultrasound tests or both. Contact a health care provider if:  You have any vaginal bleeding.  You have a fever. Get help right away if:  You have severe cramps in your stomach, back, abdomen, or pelvis.  You pass large clots or tissue. Save any tissue for your health care provider to look at.  You have more vaginal bleeding, and you faint or become lightheaded or weak. Summary  A subchorionic hematoma is a gathering of blood between the outer wall of the placenta and the uterus.  This condition can cause vaginal bleeding.  Sometimes you may have no symptoms and the bleeding may only be seen when ultrasound images are taken.  Treatment may include watchful waiting, medicines, or activity restriction. This information is not intended to replace advice given to you by your health care provider. Make sure you discuss any questions you  have with your health care provider. Document Revised: 07/28/2017 Document Reviewed: 10/11/2016 Elsevier Patient Education  [place] Medications in Pregnancy    Acne: Benzoyl Peroxide Salicylic Acid  Backache/Headache: Tylenol: 2 regular strength every 4 hours OR              2 Extra  strength every 6 hours  Colds/Coughs/Allergies: Benadryl (alcohol free) 25 mg every 6 hours as needed Breath right strips Claritin Cepacol throat lozenges Chloraseptic throat spray Cold-Eeze- up to three times per day Cough drops, alcohol free Flonase (by prescription only) Guaifenesin Mucinex Robitussin DM (plain only, alcohol free) Saline nasal spray/drops Sudafed (pseudoephedrine) & Actifed ** use only after [redacted] weeks gestation and if you do not have high blood pressure Tylenol Vicks Vaporub Zinc lozenges Zyrtec   Constipation: Colace Ducolax suppositories Fleet enema Glycerin suppositories Metamucil Milk of magnesia Miralax Senokot Smooth move tea  Diarrhea: Kaopectate Imodium A-D  *NO pepto Bismol  Hemorrhoids: Anusol Anusol HC Preparation H Tucks  Indigestion: Tums Maalox Mylanta Zantac  Pepcid  Insomnia: Benadryl (alcohol free) 25mg  every 6 hours as needed Tylenol PM Unisom, no Gelcaps  Leg Cramps: Tums MagGel  Nausea/Vomiting:  Bonine Dramamine Emetrol Ginger extract Sea bands Meclizine  Nausea medication to take during pregnancy:  Unisom (doxylamine succinate 25 mg tablets) Take one tablet daily at bedtime. If symptoms are not adequately controlled, the dose can be increased to a maximum recommended dose of two tablets daily (1/2 tablet in the morning, 1/2 tablet mid-afternoon and one at bedtime). Vitamin B6 100mg  tablets. Take one tablet twice a day (up to 200 mg per day).  Skin Rashes: Aveeno products Benadryl cream or 25mg  every 6 hours as needed Calamine Lotion 1% cortisone cream  Yeast infection: Gyne-lotrimin 7 Monistat 7   **If taking multiple medications, please check labels to avoid duplicating the same active ingredients **take medication as directed on the label ** Do not exceed 4000 mg of tylenol in 24 hours **Do not take medications that contain aspirin or ibuprofen          [facility] [place] [facility] [place] @ [facility] - accepts patients without insurance  Phone: [place] for [employer] @ [place]   Phone: [place] [facility]       Phone: [place] [facility] [place] @ [place]     Phone: [place] [facility] [place] @ [facility]   Phone: [place] [facility] [place] @ [facility] - accepts patients without insurance  Phone: [place] [facility] [place] [facility] Phone: [place] [facility] - accepts patients without insurance Phone: [facility]  Phone: [facility] Phone: [phone]  [facility] Phone: [phone]  [facility] Phone: [phone]  [facility] Phone: [phone]  [facility]  Phone: [phone]         First Trimester of Pregnancy The first trimester of pregnancy is from week 1 until the end of week 13 (months 1 through 3). A week after a sperm fertilizes an egg, the egg will  implant on the wall of the uterus. This embryo will begin to develop into a baby. Genes from you and your partner will form the baby. The female genes will determine whether the baby will be a boy or a girl. At 6-8 weeks, the eyes and face will be formed, and the heartbeat can be seen on ultrasound. At the end of 12 weeks, all the baby's organs will be formed. Now that you are pregnant, you will want to do everything you can to have a healthy baby. Two of the most important things are to get good prenatal care and to follow your health care provider's instructions. Prenatal care is all the medical care you receive before the baby's birth. This care will help prevent, find, and treat any problems during the pregnancy and childbirth. Body changes during your first  trimester Your body goes through many changes during pregnancy. The changes vary from woman to woman.  You may gain or lose a couple of pounds at first.  You may feel sick to your stomach (nauseous) and you may throw up (vomit). If the vomiting is uncontrollable, call your health care provider.  You may tire easily.  You may develop headaches that can be relieved by medicines. All medicines should be approved by your health care provider.  You may urinate more often. Painful urination may mean you have a bladder infection.  You may develop heartburn as a result of your pregnancy.  You may develop constipation because certain hormones are causing the muscles that push stool through your intestines to slow down.  You may develop hemorrhoids or swollen veins (varicose veins).  Your breasts may begin to grow larger and become tender. Your nipples may stick out more, and the tissue that surrounds them (areola) may become darker.  Your gums may bleed and may be sensitive to brushing and flossing.  Dark spots or blotches (chloasma, mask of pregnancy) may develop on your face. This will likely fade after the baby is born.  Your menstrual periods will stop.  You may have a loss of appetite.  You may develop cravings for certain kinds of food.  You may have changes in your emotions from day to day, such as being excited to be pregnant or being concerned that something may go wrong with the pregnancy and baby.  You may have more vivid and strange dreams.  You may have changes in your hair. These can include thickening of your hair, rapid growth, and changes in texture. Some women also have hair loss during or after pregnancy, or hair that feels dry or thin. Your hair will most likely return to normal after your baby is born. What to expect at prenatal visits During a routine prenatal visit:  You will be weighed to make sure you and the baby are growing normally.  Your blood pressure  will be taken.  Your abdomen will be measured to track your baby's growth.  The fetal heartbeat will be listened to between weeks 10 and 14 of your pregnancy.  Test results from any previous visits will be discussed. Your health care provider may ask you:  How you are feeling.  If you are feeling the baby move.  If you have had any abnormal symptoms, such as leaking fluid, bleeding, severe headaches, or abdominal cramping.  If you are using any tobacco products, including cigarettes, chewing tobacco, and electronic cigarettes.  If you have any questions. Other tests that may be performed during your first trimester include:  Blood tests to find your blood type and to check for the presence of any previous infections. The tests will also be used to check for low iron levels (anemia) and protein on red blood cells (Rh antibodies). Depending on your risk factors, or if you previously had diabetes during pregnancy, you may have tests to check for high blood sugar that affects pregnant women (gestational diabetes).  Urine tests to check for infections, diabetes, or protein in the urine.  An ultrasound to confirm the proper growth and development of the baby.  Fetal screens for spinal cord problems (spina bifida) and Down syndrome.  HIV (human immunodeficiency virus) testing. Routine prenatal testing includes screening for HIV, unless you choose not to have this test.  You may need other tests to make sure you and the baby are doing well. Follow these instructions at home: Medicines  Follow your health care provider's instructions regarding medicine use. Specific medicines may be either safe or unsafe to take during pregnancy.  Take a prenatal vitamin that contains at least 600 micrograms (mcg) of folic acid.  If you develop constipation, try taking a stool softener if your health care provider approves. Eating and drinking   Eat a balanced diet that includes fresh fruits and  vegetables, whole grains, good sources of protein such as meat, eggs, or tofu, and low-fat dairy. Your health care provider will help you determine the amount of weight gain that is right for you.  Avoid raw meat and uncooked cheese. These carry germs that can cause birth defects in the baby.  Eating four or five small meals rather than three large meals a day may help relieve nausea and vomiting. If you start to feel nauseous, eating a few soda crackers can be helpful. Drinking liquids between meals, instead of during meals, also seems to help ease nausea and vomiting.  Limit foods that are high in fat and processed sugars, such as fried and sweet foods.  To prevent constipation: ? Eat foods that are high in fiber, such as fresh fruits and vegetables, whole grains, and beans. ? Drink enough fluid to keep your urine clear or pale yellow. Activity  Exercise only as directed by your health care provider. Most women can continue their usual exercise routine during pregnancy. Try to exercise for 30 minutes at least 5 days a week. Exercising will help you: ? Control your weight. ? Stay in shape. ? Be prepared for labor and delivery.  Experiencing pain or cramping in the lower abdomen or lower back is a good sign that you should stop exercising. Check with your health care provider before continuing with normal exercises.  Try to avoid standing for long periods of time. Move your legs often if you must stand in one place for a long time.  Avoid heavy lifting.  Wear low-heeled shoes and practice good posture.  You may continue to have sex unless your health care provider tells you not to. Relieving pain and discomfort  Wear a good support bra to relieve breast tenderness.  Take warm sitz baths to soothe any pain or discomfort caused by hemorrhoids. Use hemorrhoid cream if your health care provider approves.  Rest with your legs elevated if you have leg cramps or low back pain.  If you  develop varicose veins in your legs, wear support hose. Elevate your feet for 15 minutes, 3-4 times a day. Limit salt in your diet. Prenatal care  Schedule your prenatal visits by the twelfth week of pregnancy. They  are usually scheduled monthly at first, then more often in the last 2 months before delivery.  Write down your questions. Take them to your prenatal visits.  Keep all your prenatal visits as told by your health care provider. This is important. Safety  Wear your seat belt at all times when driving.  Make a list of emergency phone numbers, including numbers for family, friends, the hospital, and police and fire departments. General instructions  Ask your health care provider for a referral to a local prenatal education class. Begin classes no later than the beginning of month 6 of your pregnancy.  Ask for help if you have counseling or nutritional needs during pregnancy. Your health care provider can offer advice or refer you to specialists for help with various needs.  Do not use hot tubs, steam rooms, or saunas.  Do not douche or use tampons or scented sanitary pads.  Do not cross your legs for long periods of time.  Avoid cat litter boxes and soil used by cats. These carry germs that can cause birth defects in the baby and possibly loss of the fetus by miscarriage or stillbirth.  Avoid all smoking, herbs, alcohol, and medicines not prescribed by your health care provider. Chemicals in these products affect the formation and growth of the baby.  Do not use any products that contain nicotine or tobacco, such as cigarettes and e-cigarettes. If you need help quitting, ask your health care provider. You may receive counseling support and other resources to help you quit.  Schedule a dentist appointment. At home, brush your teeth with a soft toothbrush and be gentle when you floss. Contact a health care provider if:  You have dizziness.  You have mild pelvic cramps, pelvic  pressure, or nagging pain in the abdominal area.  You have persistent nausea, vomiting, or diarrhea.  You have a bad smelling vaginal discharge.  You have pain when you urinate.  You notice increased swelling in your face, hands, legs, or ankles.  You are exposed to fifth disease or chickenpox.  You are exposed to [place] measles (rubella) and have never had it. Get help right away if:  You have a fever.  You are leaking fluid from your vagina.  You have spotting or bleeding from your vagina.  You have severe abdominal cramping or pain.  You have rapid weight gain or loss.  You vomit blood or material that looks like coffee grounds.  You develop a severe headache.  You have shortness of breath.  You have any kind of trauma, such as from a fall or a car accident. Summary  The first trimester of pregnancy is from week 1 until the end of week 13 (months 1 through 3).  Your body goes through many changes during pregnancy. The changes vary from woman to woman.  You will have routine prenatal visits. During those visits, your health care provider will examine you, discuss any test results you may have, and talk with you about how you are feeling. This information is not intended to replace advice given to you by your health care provider. Make sure you discuss any questions you have with your health care provider. Document Revised: 07/28/2017 Document Reviewed: 07/27/2016 Elsevier Patient Education  2020 [employer].

## 2020-08-18 NOTE — MAU Provider Note (Signed)
History     CSN: [id number]  Arrival date and time: 08/18/20 1201   Event Date/Time   First Provider Initiated Contact with Patient 08/18/20 1749      Chief Complaint  Patient presents with  . Vaginal Bleeding   [name] is a 34 y.o. G3P1011 at [redacted]w[redacted]d who presents to MAU for vaginal bleeding which began this morning around 4AM. Patient reports she had intercourse last night. Around 4AM she woke up to use the restroom and saw blood in the toilet. The same thing happened again around 7AM. Patient reports she was wearing a pad, but that the bleeding has always been less than a period and has almost stopped at this point. Patient also reports intermittent cramping and low back cramping.  Passing blood clots? no Blood soaking clothes? no Lightheaded/dizzy? no Significant pelvic pain or cramping? Per above Passed any tissue? no  Pt denies vaginal discharge/odor/itching. Pt denies N/V, abdominal pain, constipation, diarrhea, or urinary problems. Pt denies fever, chills, fatigue, sweating or changes in appetite. Pt denies SOB or chest pain. Pt denies dizziness, HA, light-headedness, weakness.   OB History    Gravida  3   Para  1   Term  1   Preterm      AB  1   Living  1     SAB      IAB  1   Ectopic      Multiple      Live Births  1           Past Medical History:  Diagnosis Date  . Asthma    had inhaler in 2011 unsure of name  . Chlamydia   . Endometriosis   . Gonorrhea in female   . No pertinent past medical history   . Trichomoniasis     Past Surgical History:  Procedure Laterality Date  . LAPAROSCOPY  2005  . NO PAST SURGERIES      Family History  Problem Relation Age of Onset  . Depression Mother   . Stroke Paternal Grandmother   . Heart disease Paternal Grandmother   . Anesthesia problems Neg Hx   . Hypotension Neg Hx   . Malignant hyperthermia Neg Hx   . Pseudochol deficiency Neg Hx     Social History   Tobacco Use   . Smoking status: Current Some Day Smoker    Types: Cigarettes  . Smokeless tobacco: Never Used  Substance Use Topics  . Alcohol use: No  . Drug use: No    Allergies:  Allergies  Allergen Reactions  . Ibuprofen Other (See Comments)    tachycardia  . Latex Itching    condoms    No medications prior to admission.    Review of Systems  Constitutional: Negative for chills, diaphoresis, fatigue and fever.  Eyes: Negative for visual disturbance.  Respiratory: Negative for shortness of breath.   Cardiovascular: Negative for chest pain.  Gastrointestinal: Negative for abdominal pain, constipation, diarrhea, nausea and vomiting.  Genitourinary: Negative for dysuria, flank pain, frequency, pelvic pain, urgency, vaginal bleeding and vaginal discharge.  Neurological: Negative for dizziness, weakness, light-headedness and headaches.   Physical Exam   Blood pressure 105/69, pulse 68, temperature 98.3 F (36.8 C), resp. rate 16, height 5\' 2"  (1.575 m), weight 71.7 kg, last menstrual period 07/10/2020, SpO2 99 %, unknown if currently breastfeeding.  Patient Vitals for the past 24 hrs:  BP Temp Pulse Resp SpO2 Height Weight  08/18/20 1221 105/69 98.3 F (36.8  C) 68 16 99 % 5\' 2"  (1.575 m) 71.7 kg   Physical Exam Vitals and nursing note reviewed.  Constitutional:      General: She is not in acute distress.    Appearance: Normal appearance. She is not ill-appearing, toxic-appearing or diaphoretic.  HENT:     Head: Normocephalic and atraumatic.  Pulmonary:     Effort: Pulmonary effort is normal.  Neurological:     Mental Status: She is alert and oriented to person, place, and time.  Psychiatric:        Mood and Affect: Mood normal.        Behavior: Behavior normal.        Thought Content: Thought content normal.        Judgment: Judgment normal.    Results for orders placed or performed during the hospital encounter of 08/18/20 (from the past 24 hour(s))  Pregnancy, urine POC      Status: Abnormal   Collection Time: 08/18/20 12:16 PM  Result Value Ref Range   Preg Test, Ur POSITIVE (A) NEGATIVE  Urinalysis, Routine w reflex microscopic     Status: Abnormal   Collection Time: 08/18/20 12:19 PM  Result Value Ref Range   Color, Urine YELLOW YELLOW   APPearance CLOUDY (A) CLEAR   Specific Gravity, Urine 1.023 1.005 - 1.030   pH 7.0 5.0 - 8.0   Glucose, UA NEGATIVE NEGATIVE mg/dL   Hgb urine dipstick LARGE (A) NEGATIVE   Bilirubin Urine NEGATIVE NEGATIVE   Ketones, ur NEGATIVE NEGATIVE mg/dL   Protein, ur 30 (A) NEGATIVE mg/dL   Nitrite NEGATIVE NEGATIVE   Leukocytes,Ua NEGATIVE NEGATIVE   RBC / HPF >50 (H) 0 - 5 RBC/hpf   WBC, UA 6-10 0 - 5 WBC/hpf   Bacteria, UA FEW (A) NONE SEEN   Squamous Epithelial / LPF 0-5 0 - 5   Amorphous Crystal PRESENT   CBC     Status: None   Collection Time: 08/18/20  1:34 PM  Result Value Ref Range   WBC 9.5 4.0 - 10.5 K/uL   RBC 3.96 3.87 - 5.11 MIL/uL   Hemoglobin 12.9 12.0 - 15.0 g/dL   HCT 37.5 36.0 - 46.0 %   MCV 94.7 80.0 - 100.0 fL   MCH 32.6 26.0 - 34.0 pg   MCHC 34.4 30.0 - 36.0 g/dL   RDW 14.5 11.5 - 15.5 %   Platelets 202 150 - 400 K/uL   nRBC 0.0 0.0 - 0.2 %  Comprehensive metabolic panel     Status: Abnormal   Collection Time: 08/18/20  1:34 PM  Result Value Ref Range   Sodium 137 135 - 145 mmol/L   Potassium 4.1 3.5 - 5.1 mmol/L   Chloride 103 98 - 111 mmol/L   CO2 26 22 - 32 mmol/L   Glucose, Bld 76 70 - 99 mg/dL   BUN 7 6 - 20 mg/dL   Creatinine, Ser 0.58 0.44 - 1.00 mg/dL   Calcium 9.3 8.9 - 10.3 mg/dL   Total Protein 6.6 6.5 - 8.1 g/dL   Albumin 3.7 3.5 - 5.0 g/dL   AST 17 15 - 41 U/L   ALT 13 0 - 44 U/L   Alkaline Phosphatase 33 (L) 38 - 126 U/L   Total Bilirubin 0.5 0.3 - 1.2 mg/dL   GFR, Estimated >60 >60 mL/min   Anion gap 8 5 - 15  hCG, quantitative, pregnancy     Status: Abnormal   Collection Time: 08/18/20  1:34 PM  Result Value Ref Range   hCG, Beta Chain, Quant, S 16,165 (H) <5  mIU/mL  ABO/Rh     Status: None   Collection Time: 08/18/20  1:34 PM  Result Value Ref Range   ABO/RH(D) O POS    No rh immune globuloin      NOT A RH IMMUNE GLOBULIN CANDIDATE, PT RH POSITIVE Performed at [facility], [address], [place], [place] [address]   Wet prep, genital     Status: Abnormal   Collection Time: 08/18/20  2:22 PM  Result Value Ref Range   Yeast Wet Prep HPF POC NONE SEEN NONE SEEN   Trich, Wet Prep NONE SEEN NONE SEEN   Clue Cells Wet Prep HPF POC NONE SEEN NONE SEEN   WBC, Wet Prep HPF POC MODERATE (A) NONE SEEN   Sperm NONE SEEN    US OB LESS THAN 14 WEEKS WITH OB TRANSVAGINAL  Result Date: 08/18/2020 CLINICAL DATA:  Vaginal bleeding. EXAM: OBSTETRIC <14 WK [place] AND TRANSVAGINAL OB US TECHNIQUE: Both transabdominal and transvaginal ultrasound examinations were performed for complete evaluation of the gestation as well as the maternal uterus, adnexal regions, and pelvic cul-de-sac. Transvaginal technique was performed to assess early pregnancy. COMPARISON:  Ultrasound report 02/11/2017. FINDINGS: Intrauterine gestational sac: Irregular single intrauterine gestational sac. Yolk sac:  Present Embryo:  Present Cardiac Activity: Present Heart Rate: 91 bpm CRL: 3.2 mm   5 w   6 d                  [place] EDC: 04/14/2021 Subchorionic hemorrhage:  Moderate size subchorionic hemorrhage Maternal uterus/adnexae: Probable right ovarian corpus luteal cyst. Trace free pelvic fluid. IMPRESSION: 1.  Moderate size subchorionic hemorrhage. 2. Single viable intrauterine pregnancy at 5 weeks 6 days. Gestational sac is irregular. Fetal heart rate is 91 beats per minute. Given these findings close follow-up suggested. Electronically Signed   By: Maisie Fus  Register   On: 08/18/2020 16:06    MAU Course  Procedures  MDM -r/o ectopic -UA: cloudy/lg hgb/30PRO/few bacteria, sending urine for culture -CBC: WNL -CMP: no abnormalities requiring treatment -[place]: irregular single GS, +yolk sac,  +embryo, +FHR 91, [redacted]w[redacted]d c/w LMP, mod SCH -hCG: 16,165 -ABO: O Positive -WetPrep: WNL -GC/CT collected -pt discharged to home in stable condition  Orders Placed This Encounter  Procedures  . Wet prep, genital    Standing Status:   Standing    Number of Occurrences:   1  . Culture, OB Urine    Standing Status:   Standing    Number of Occurrences:   1  . US OB LESS THAN 14 WEEKS WITH OB TRANSVAGINAL    Standing Status:   Standing    Number of Occurrences:   1    Order Specific Question:   Symptom/Reason for Exam    Answer:   Vaginal bleeding in pregnancy [[id number]]  . US OB LESS THAN 14 WEEKS WITH OB TRANSVAGINAL    Standing Status:   Future    Standing Expiration Date:   08/18/2021    Order Specific Question:   Reason for Exam (SYMPTOM  OR DIAGNOSIS REQUIRED)    Answer:   viability    Order Specific Question:   Preferred Imaging Location?    Answer:   [facility]  . Urinalysis, Routine w reflex microscopic Urine, Clean Catch    Standing Status:   Standing    Number of Occurrences:   1  . CBC    Standing  Status:   Standing    Number of Occurrences:   1  . Comprehensive metabolic panel    Standing Status:   Standing    Number of Occurrences:   1  . hCG, quantitative, pregnancy    Standing Status:   Standing    Number of Occurrences:   1  . Pregnancy, urine POC    Standing Status:   Standing    Number of Occurrences:   1  . ABO/Rh    Standing Status:   Standing    Number of Occurrences:   1  . Discharge patient    Order Specific Question:   Discharge disposition    Answer:   01-Home or Self Care [1]    Order Specific Question:   Discharge patient date    Answer:   08/18/2020   No orders of the defined types were placed in this encounter.  Assessment and Plan   1. Subchorionic hemorrhage of placenta in first trimester, single or unspecified fetus   2. Vaginal bleeding in pregnancy   3. Intrauterine pregnancy   4. [redacted] weeks gestation of pregnancy   5. Blood type, Rh  positive     Allergies as of 08/18/2020      Reactions   Ibuprofen Other (See Comments)   tachycardia   Latex Itching   condoms      Medication List    TAKE these medications   acetaminophen 500 MG tablet Commonly known as: TYLENOL Take 1,000 mg by mouth every 6 (six) hours as needed for mild pain.   traMADol 50 MG tablet Commonly known as: ULTRAM Take 1 tablet (50 mg total) by mouth every 6 (six) hours as needed.       -will call with culture results, if positive -safe meds in pregnancy list given -list of OB providers given -discussed expected s/sx of [facility] -return MAU precautions given -f/u US for viability ordered -pt discharged to home in stable condition  Elmyra Ricks E Almando Brawley 08/18/2020, 6:27 PM

## 2020-08-18 NOTE — MAU Note (Signed)
.   Erica Mcclure is a 34 y.o. at [redacted]w[redacted]d here in MAU reporting: vaginal bleeding that woke her up out of her sleep around 4 this morning. Pt states that she is still having to wear a pad and is having lower abdominal cramping. Intercourse last night LMP: 07/10/2020 Onset of complaint: 4 am Pain score: 7 Vitals:   08/18/20 1221  BP: 105/69  Pulse: 68  Resp: 16  Temp: 98.3 F (36.8 C)  SpO2: 99%     [facility] Lab orders placed from triage: UA/UPT

## 2020-08-19 LAB — GC/CHLAMYDIA PROBE AMP (~~LOC~~) NOT AT ARMC
Chlamydia: NEGATIVE
Comment: NEGATIVE
Comment: NORMAL
Neisseria Gonorrhea: NEGATIVE

## 2020-08-20 LAB — CULTURE, OB URINE

## 2022-05-18 ENCOUNTER — Other Ambulatory Visit: Payer: Self-pay

## 2022-05-18 ENCOUNTER — Emergency Department (HOSPITAL_COMMUNITY)
Admission: EM | Admit: 2022-05-18 | Discharge: 2022-05-18 | Disposition: A | Discharge status: Home / Self Care | Payer: Managed Care, Other (non HMO) | Attending: Emergency Medicine | Admitting: Emergency Medicine

## 2022-05-18 DIAGNOSIS — Z5321 Procedure and treatment not carried out due to patient leaving prior to being seen by health care provider: Secondary | ICD-10-CM | POA: Diagnosis not present

## 2022-05-18 DIAGNOSIS — M7989 Other specified soft tissue disorders: Secondary | ICD-10-CM | POA: Diagnosis present

## 2022-05-18 DIAGNOSIS — W540XXA Bitten by dog, initial encounter: Secondary | ICD-10-CM | POA: Insufficient documentation

## 2022-05-18 NOTE — ED Triage Notes (Signed)
Pt. Stated, I was bitten by my dog yesterday and I went to [facility] and they did not treat me well at all. Im still picking out dog hair and I didn't think that's the way you should be treated. My hand, thumb is so sore and cant hardly move it.

## 2024-06-10 ENCOUNTER — Other Ambulatory Visit: Payer: Self-pay

## 2024-06-10 ENCOUNTER — Encounter (HOSPITAL_BASED_OUTPATIENT_CLINIC_OR_DEPARTMENT_OTHER): Payer: Self-pay

## 2024-06-10 ENCOUNTER — Emergency Department (HOSPITAL_BASED_OUTPATIENT_CLINIC_OR_DEPARTMENT_OTHER)
Admission: EM | Admit: 2024-06-10 | Discharge: 2024-06-10 | Disposition: A | Discharge status: Home / Self Care | Attending: Emergency Medicine | Admitting: Emergency Medicine

## 2024-06-10 DIAGNOSIS — N939 Abnormal uterine and vaginal bleeding, unspecified: Secondary | ICD-10-CM | POA: Insufficient documentation

## 2024-06-10 LAB — URINALYSIS, MICROSCOPIC (REFLEX): Bacteria, UA: NONE SEEN

## 2024-06-10 LAB — URINALYSIS, ROUTINE W REFLEX MICROSCOPIC
Bilirubin Urine: NEGATIVE
Glucose, UA: NEGATIVE mg/dL
Ketones, ur: NEGATIVE mg/dL
Leukocytes,Ua: NEGATIVE
Nitrite: NEGATIVE
Protein, ur: NEGATIVE mg/dL
Specific Gravity, Urine: 1.015 (ref 1.005–1.030)
pH: 6.5 (ref 5.0–8.0)

## 2024-06-10 LAB — WET PREP, GENITAL
Sperm: NONE SEEN
Trich, Wet Prep: NONE SEEN
WBC, Wet Prep HPF POC: <10 (ref <10)
Yeast Wet Prep HPF POC: NONE SEEN

## 2024-06-10 LAB — PREGNANCY, URINE: Preg Test, Ur: NEGATIVE

## 2024-06-10 NOTE — ED Notes (Signed)
 Discharge instructions reviewed with patient. Patient verbalizes understanding, no further questions at this time. Medications and follow up information provided. No acute distress noted at time of departure.

## 2024-06-10 NOTE — ED Provider Notes (Signed)
 [place] [facility] Provider Note   CSN: [id number] Arrival date & time: 06/10/24  9040     Patient presents with: Vaginal Bleeding   Erica Mcclure is a 38 y.o. female.   Presents to the emergency department today for evaluation of atypical vaginal bleeding.  Patient's last menstrual period was normal, she reports about 2-1/2 weeks ago.  She does state the possibility of being pregnant, however she took a home pregnancy test yesterday which was negative.  She passed some tissue 2 days ago in the toilet while she was urinating.  Afterwards she developed dark red vaginal bleeding, less than normal.  As well as some back pain.  She also had a headache yesterday that is improved today but not completely resolved.  No vomiting or diarrhea.  No dysuria or increased frequency or urgency.  No treatments prior to arrival.       Prior to Admission medications   Medication Sig Start Date End Date Taking? Authorizing Provider  acetaminophen  (TYLENOL ) 500 MG tablet Take 1,000 mg by mouth every 6 (six) hours as needed for mild pain.    [provider]  traMADol  (ULTRAM ) 50 MG tablet Take 1 tablet (50 mg total) by mouth every 6 (six) hours as needed. 02/11/17   Leftwich-Kirby, [place] LABOR, CNM    Allergies: Ibuprofen  and Latex    Review of Systems  Updated Vital Signs BP (!) 139/94   Pulse (!) 58   Temp 97.9 F (36.6 C)   Resp 16   LMP 05/21/2024   SpO2 98%   Physical Exam Vitals and nursing note reviewed. Exam conducted with a chaperone present.  Constitutional:      Appearance: She is well-developed.  HENT:     Head: Normocephalic and atraumatic.  Eyes:     Conjunctiva/sclera: Conjunctivae normal.  Pulmonary:     Effort: No respiratory distress.  Abdominal:     Tenderness: There is no abdominal tenderness. There is no guarding or rebound.  Genitourinary:    Exam position: Lithotomy position.     Vagina: Bleeding present.      Cervix: Cervical bleeding present. No cervical motion tenderness.     Uterus: Not tender.      Adnexa:        Right: No tenderness.         Left: Tenderness (Mild) present.   Musculoskeletal:     Cervical back: Normal range of motion and neck supple.  Skin:    General: Skin is warm and dry.  Neurological:     Mental Status: She is alert.     (all labs ordered are listed, but only abnormal results are displayed) Labs Reviewed  URINALYSIS, ROUTINE W REFLEX MICROSCOPIC  PREGNANCY, URINE    EKG: None  Radiology: No results found.   Procedures   Medications Ordered in the ED - No data to display  ED Course  Patient seen and examined. History obtained directly from patient.   Labs/EKG: Ordered pregnancy test, UA  Imaging: None ordered  Medications/Fluids: None ordered  Most recent vital signs reviewed and are as follows: BP (!) 139/94   Pulse (!) 58   Temp 97.9 F (36.6 C)   Resp 16   LMP 05/21/2024   SpO2 98%   Initial impression: Atypical vaginal bleeding, not likely related..  Passage of tissue, concerning symptoms for miscarriage and will check pregnancy test here.  If positive, will need ultrasound.  If negative will offer pelvic exam.  11:45 AM Reassessment performed. Patient appears comfortable.  Labs personally reviewed and interpreted including: Pregnancy negative.  Reviewed pertinent lab work and imaging with patient at bedside. Questions answered.   Most current vital signs reviewed and are as follows: BP (!) 139/94   Pulse (!) 58   Temp 97.9 F (36.6 C)   Resp 16   LMP 05/21/2024   SpO2 98%   Plan: Discussed results.  Discussed options of monitoring symptoms to ensure that they improve with outpatient OB/GYN follow-up versus additional evaluation here today with pelvic exam and swabs.  Patient would like to proceed with pelvic exam.  12:05 PM Pelvic exam performed with NT chaperone.   1:01 PM Reassessment performed. Patient appears stable,  comfortable.  Labs personally reviewed and interpreted including: Wet prep with clue cells but exam not overly consistent for BV.  Reviewed pertinent lab work and imaging with patient at bedside. Questions answered.   Most current vital signs reviewed and are as follows: BP (!) 139/94   Pulse (!) 58   Temp 97.9 F (36.6 C)   Resp 16   LMP 05/21/2024   SpO2 98%   Plan: Discharge to home.   Prescriptions written for: None  Other home care instructions discussed: NSAIDs for any discomfort, close monitoring of symptoms  ED return instructions discussed: Return with heavier bleeding or worsening abdominal, back or pelvic pain.  Follow-up instructions discussed: Patient encouraged to follow-up with their PCP or OB/GYN referral in 7 days.                                    Medical Decision Making Amount and/or Complexity of Data Reviewed Labs: ordered.   Patient with abnormal uterine bleeding.  Currently symptoms are improving with decreasing pain and bleeding today.  Mild bleeding noted on pelvic exam.  UA without signs of infection.  Wet prep negative and do not suspect bacterial vaginosis at this time.  Patient is not pregnant.  Encouraged continued monitoring of symptoms, outpatient OB/GYN follow-up.     Final diagnoses:  Abnormal uterine bleeding    ED Discharge Orders     None          Desiderio Chew, PA-C 06/10/24 1303    Armenta Canning, MD 06/10/24 1333

## 2024-06-10 NOTE — ED Triage Notes (Signed)
 Pt reports passing clump of tissue on Saturday. Bled heavily after and started cramping for the rest of the day. Still has light pink bleeding now. HA and back pain. Neg pregnancy test

## 2024-06-10 NOTE — Discharge Instructions (Addendum)
 Please read and follow all provided instructions.  Your diagnoses today include:  1. Abnormal uterine bleeding    Tests performed today include: Wet prep: No significant finding, does show some clue cells which can indicate bacterial vaginosis but your current symptoms do not suggest that Pregnancy test: Was negative Urine testing: Did not show signs of infection Testing for gonorrhea/chlamydia: Pending results Vital signs. See below for your results today.   Medications prescribed:  Please use over-the-counter NSAID medications (ibuprofen , naproxen) or Tylenol  (acetaminophen ) as directed on the packaging for pain -- as long as you do not have any reasons avoid these medications. Reasons to avoid NSAID medications include: weak kidneys, a history of bleeding in your stomach or gut, or uncontrolled high blood pressure or previous heart attack. Reasons to avoid Tylenol  include: liver problems or ongoing alcohol use. Never take more than 4000mg  or 8 Extra strength Tylenol  in a 24 hour period.     Take any prescribed medications only as directed.  Home care instructions:  Follow any educational materials contained in this packet.  BE VERY CAREFUL not to take multiple medicines containing Tylenol  (also called acetaminophen ). Doing so can lead to an overdose which can damage your liver and cause liver failure and possibly death.   Follow-up instructions: Please follow-up with your primary care provider or OB/GYN in 1 week for further evaluation of your symptoms.   Return instructions:  Please return to the Emergency Department if you experience worsening symptoms.  Return with heavier amounts of bleeding, worsening abdominal, pelvic or back pain, new or worsening symptoms Please return if you have any other emergent concerns.  Additional Information:  Your vital signs today were: BP (!) 139/94   Pulse (!) 58   Temp 97.9 F (36.6 C)   Resp 16   LMP 05/21/2024   SpO2 98%  If your  blood pressure (BP) was elevated above 135/85 this visit, please have this repeated by your doctor within one month. --------------

## 2024-06-11 LAB — GC/CHLAMYDIA PROBE AMP (~~LOC~~) NOT AT ARMC
Chlamydia: NEGATIVE
Comment: NEGATIVE
Comment: NORMAL
Neisseria Gonorrhea: NEGATIVE
